# Patient Record
Sex: Female | Born: 1955 | Race: White | Hispanic: No | Marital: Married | Smoking: Former smoker
Health system: Southern US, Community
[De-identification: ages and names within clinical notes are randomized; demographics above are authoritative.]

## PROBLEM LIST (undated history)

## (undated) DIAGNOSIS — J309 Allergic rhinitis, unspecified: Secondary | ICD-10-CM

## (undated) DIAGNOSIS — J45909 Unspecified asthma, uncomplicated: Secondary | ICD-10-CM

## (undated) DIAGNOSIS — M545 Low back pain, unspecified: Secondary | ICD-10-CM

## (undated) DIAGNOSIS — E785 Hyperlipidemia, unspecified: Secondary | ICD-10-CM

## (undated) HISTORY — DX: Hyperlipidemia, unspecified: E78.5

## (undated) HISTORY — DX: Allergic rhinitis, unspecified: J30.9

## (undated) HISTORY — DX: Low back pain: M54.5

## (undated) HISTORY — DX: Unspecified asthma, uncomplicated: J45.909

## (undated) HISTORY — DX: Low back pain, unspecified: M54.50

---

## 2005-03-19 ENCOUNTER — Ambulatory Visit: Payer: Self-pay | Admitting: Pulmonary Disease

## 2006-06-05 ENCOUNTER — Ambulatory Visit: Payer: Self-pay | Admitting: Pulmonary Disease

## 2007-01-12 ENCOUNTER — Telehealth (INDEPENDENT_AMBULATORY_CARE_PROVIDER_SITE_OTHER): Payer: Self-pay | Admitting: *Deleted

## 2007-01-14 DIAGNOSIS — J309 Allergic rhinitis, unspecified: Secondary | ICD-10-CM | POA: Insufficient documentation

## 2007-01-14 DIAGNOSIS — Z87898 Personal history of other specified conditions: Secondary | ICD-10-CM | POA: Insufficient documentation

## 2007-01-14 DIAGNOSIS — K589 Irritable bowel syndrome without diarrhea: Secondary | ICD-10-CM

## 2007-01-14 DIAGNOSIS — J45909 Unspecified asthma, uncomplicated: Secondary | ICD-10-CM

## 2007-12-10 ENCOUNTER — Ambulatory Visit: Payer: Self-pay | Admitting: Pulmonary Disease

## 2007-12-26 DIAGNOSIS — M545 Low back pain: Secondary | ICD-10-CM

## 2008-01-21 ENCOUNTER — Ambulatory Visit: Payer: Self-pay | Admitting: Pulmonary Disease

## 2008-01-23 LAB — CONVERTED CEMR LAB
ALT: 18 units/L (ref 0–35)
AST: 23 units/L (ref 0–37)
Albumin: 3.6 g/dL (ref 3.5–5.2)
Alkaline Phosphatase: 25 units/L — ABNORMAL LOW (ref 39–117)
BUN: 12 mg/dL (ref 6–23)
Basophils Relative: 1.3 % (ref 0.0–3.0)
CO2: 32 meq/L (ref 19–32)
Chloride: 105 meq/L (ref 96–112)
Cholesterol: 199 mg/dL (ref 0–200)
Creatinine, Ser: 0.7 mg/dL (ref 0.4–1.2)
Eosinophils Absolute: 0 10*3/uL (ref 0.0–0.7)
Eosinophils Relative: 0.7 % (ref 0.0–5.0)
GFR calc non Af Amer: 93 mL/min
Hemoglobin, Urine: NEGATIVE
LDL Cholesterol: 120 mg/dL — ABNORMAL HIGH (ref 0–99)
Leukocytes, UA: NEGATIVE
Lymphocytes Relative: 34.7 % (ref 12.0–46.0)
Neutrophils Relative %: 56.6 % (ref 43.0–77.0)
Nitrite: NEGATIVE
Platelets: 162 10*3/uL (ref 150–400)
Potassium: 4.1 meq/L (ref 3.5–5.1)
RBC: 4.6 M/uL (ref 3.87–5.11)
Specific Gravity, Urine: 1.02 (ref 1.000–1.03)
TSH: 1.69 microintl units/mL (ref 0.35–5.50)
Total Bilirubin: 0.9 mg/dL (ref 0.3–1.2)
Total CHOL/HDL Ratio: 2.8
Urine Glucose: NEGATIVE mg/dL
Urobilinogen, UA: 0.2 (ref 0.0–1.0)
VLDL: 10 mg/dL (ref 0–40)
WBC: 4.8 10*3/uL (ref 4.5–10.5)

## 2008-02-02 ENCOUNTER — Telehealth (INDEPENDENT_AMBULATORY_CARE_PROVIDER_SITE_OTHER): Payer: Self-pay | Admitting: *Deleted

## 2009-09-21 ENCOUNTER — Ambulatory Visit: Payer: Self-pay | Admitting: Pulmonary Disease

## 2009-09-21 DIAGNOSIS — E7800 Pure hypercholesterolemia, unspecified: Secondary | ICD-10-CM

## 2010-02-05 NOTE — Assessment & Plan Note (Signed)
Summary: rov/jd   CC:  21 month ROV & review....  History of Present Illness: 55 y/o WF here for a follow up visit... she is the wife of Collins Dimaria & they live in [place], [place]- but still maintain their general medical care here in [place]...   ~  September 21, 2009:  Carla Wong has had a good year- doing well w/o new complaints or concerns... she has AR & Asthma & takes Zyrtek/ Nasonex/ Singulair/ Flovent & Proventil... also uses Mucinex Prn congestion... requests 90d refill perscriptions... states she had labs done by GYN in [place]- DrFletcher, and she reports that everything was "OK"...   Current Problem List:   PHYSICAL EXAMINATION (ICD-V70.0) - GYN= DrFletcher [place]... also takes calcium/ MVI/ etc...  ALLERGIC RHINITIS (ICD-477.9) - uses ZYRTEK, NASONEX, MUCINEX...  ASTHMA (ICD-493.90) - on SINGULAIR 10mg /d, PROVENTIL HFA 2sp Prn, & FLOVENT 220 2puffsBid... she has had a good year without exacerbations...  Hx of HYPERCHOLESTEROLEMIA, BORDERLINE (ICD-272.4) - on diet alone...  ~  FLP here 1/10 showed TChol 199, TG 48, HDL 70, LDL 120... discussed diet Rx.  ~  9/11: she notes labs done this yr by GYN & Chol was "OK" per pt hx...  IRRITABLE BOWEL SYNDROME (ICD-564.1) - mild symptoms usually related to eating the wrong things... she is now 55 y/o and needs screening colonoscopy> she is undecided about getting this in [place] vs [place] but she will sched at her convenience.  Hx of BACK PAIN, LUMBAR (ICD-724.2) - uses OTC pain meds including Ibuprofen, Tylenol...  HEADACHES, HX OF (ICD-V13.8) - prev used Midrin w/ good relief, now that this is no longer avail we will switch to [facility] for Prn use.   Preventive Screening-Counseling & Management  Alcohol-Tobacco     Smoking Status: quit     Year Quit: 1982  Allergies (verified): No Known Drug Allergies  Comments:  Nurse/Medical Assistant: The patient's medications and allergies were reviewed with the patient and were updated  in the Medication and Allergy Lists.  Past History:  Past Medical History: ALLERGIC RHINITIS (ICD-477.9) ASTHMA (ICD-493.90) Hx of HYPERCHOLESTEROLEMIA, BORDERLINE (ICD-272.4) IRRITABLE BOWEL SYNDROME (ICD-564.1) Hx of BACK PAIN, LUMBAR (ICD-724.2) HEADACHES, HX OF (ICD-V13.8)  Family History: Reviewed history from 12/10/2007 and no changes required. Father alive age 54 w/ HBP & CAD- s/p CABG Mother alive age 47 w/ HBP, Hyperchol, & heart disease 1 Sibling- sister w/ hx uterine cancer, s/p hyst  Social History: Reviewed history from 12/10/2007 and no changes required. married- husb [name] & they live in [place] 2 children quit smoking in 1982---smoked for about 3-4 years social alcohol  Review of Systems       The patient complains of nasal congestion, cough, and hay fever.  The patient denies fever, chills, sweats, anorexia, fatigue, weakness, malaise, weight loss, sleep disorder, blurring, diplopia, eye irritation, eye discharge, vision loss, eye pain, photophobia, earache, ear discharge, tinnitus, decreased hearing, nosebleeds, sore throat, hoarseness, chest pain, palpitations, syncope, dyspnea on exertion, orthopnea, PND, peripheral edema, dyspnea at rest, excessive sputum, hemoptysis, wheezing, pleurisy, nausea, vomiting, diarrhea, constipation, change in bowel habits, abdominal pain, melena, hematochezia, jaundice, gas/bloating, indigestion/heartburn, dysphagia, odynophagia, dysuria, hematuria, urinary frequency, urinary hesitancy, nocturia, incontinence, back pain, joint pain, joint swelling, muscle cramps, muscle weakness, stiffness, arthritis, sciatica, restless legs, leg pain at night, leg pain with exertion, rash, itching, dryness, suspicious lesions, paralysis, paresthesias, seizures, tremors, vertigo, transient blindness, frequent falls, frequent headaches, difficulty walking, depression, anxiety, memory loss, confusion, cold intolerance, heat intolerance, polydipsia,  polyphagia, polyuria, unusual weight change, abnormal bruising, bleeding, enlarged lymph nodes, urticaria, and recurrent infections.    Vital Signs:  Patient profile:   55 year old female Height:      63.75 inches Weight:      127.50 pounds BMI:     22.14 O2 Sat:      99 % on Room air Temp:     98.1 degrees F oral Pulse rate:   63 / minute BP sitting:   120 / 78  (right arm) Cuff size:   regular  Vitals Entered By: Randell Loop CMA (September 21, 2009 12:03 PM)  O2 Sat at Rest %:  99 O2 Flow:  Room air CC: 21 month ROV & review... Is Patient Diabetic? No Pain Assessment Patient in pain? no      Comments meds updated today with pt   Physical Exam  Additional Exam:  WD, WN, 55 y/o WF in NAD GENERAL:  Alert & oriented; pleasant & cooperative... HEENT:  Carla Wong, EOM-wnl, PERRLA, Fundi-benign, EACs-clear, TMs-wnl, NOSE-congested, THROAT-clear & wnl. NECK:  Supple w/ full ROM; no JVD; normal carotid impulses w/o bruits; no thyromegaly or nodules palpated; no lymphadenopathy. CHEST:  Clear to P & A; without wheezes/ rales/ or rhonchi. HEART:  Regular Rhythm; without murmurs/ rubs/ or gallops. ABDOMEN:  Soft & nontender; normal bowel sounds; no organomegaly or masses detected. EXT: without deformities or arthritic changes; no varicose veins/ venous insuffic/ or edema. NEURO:  CN's intact; motor testing normal; sensory testing normal; gait normal & balance OK. DERM:  No lesions noted; no rash etc...    Impression & Recommendations:  Problem # 1:  ALLERGIC RHINITIS (ICD-477.9) Stable on meds and Mucinex OTC... Her updated medication list for this problem includes:    Zyrtec Allergy 10 Mg Tabs (Cetirizine hcl) .Marland Kitchen... Take 1 tablet by mouth once a day    Nasonex 50 Mcg/act Susp (Mometasone furoate) .Marland Kitchen... 2 sp in each nostril at bedtime...  Problem # 2:  ASTHMA (ICD-493.90) Stable on meds listed... Her updated medication list for this problem includes:    Singulair 10 Mg Tabs  (Montelukast sodium) .Marland Kitchen... Take 1 tablet by mouth once a day    Proventil Hfa 108 (90 Base) Mcg/act Aers (Albuterol sulfate) .Marland Kitchen... 2 sprays every 6 h as needed for wheezing...    Flovent Hfa 220 Mcg/act Aero (Fluticasone propionate  hfa) ..... Inhale 2 puffs two times a day  Problem # 3:  Hx of HYPERCHOLESTEROLEMIA, BORDERLINE (ICD-272.4) She had labs from GYN & Chol was OK by her report... we reviewed diet + exercise Rx...  Problem # 4:  IRRITABLE BOWEL SYNDROME (ICD-564.1) Doing well from the GI perspective but needs screening colonoscopy & she is asked to sched this at her convenience...  Problem # 5:  HEADACHES, HX OF (ICD-V13.8) We discussed replacing the Midrin she has relied on for yrs... try Vicodin for Prn use.  Complete Medication List: 1)  Zyrtec Allergy 10 Mg Tabs (Cetirizine hcl) .... Take 1 tablet by mouth once a day 2)  Nasonex 50 Mcg/act Susp (Mometasone furoate) .... 2 sp in each nostril at bedtime.Marland KitchenMarland Kitchen 3)  Singulair 10 Mg Tabs (Montelukast sodium) .... Take 1 tablet by mouth once a day 4)  Proventil Hfa 108 (90 Base) Mcg/act Aers (Albuterol sulfate) .... 2 sprays every 6 h as needed for wheezing... 5)  Flovent Hfa 220 Mcg/act Aero (Fluticasone propionate  hfa) .... Inhale 2 puffs two times a day 6)  Necon 1/50 (28) 1-50 Mg-mcg Tabs (  Norethindrone-mestranol) .... Take 1 tablet by mouth once a day 7)  Hydrocodone-acetaminophen 5-500 Mg Tabs (Hydrocodone-acetaminophen) .... Take 1 tab by mouth every 6 h as needed for severe headaches... 8)  [facility]vs Ibuprofen 200 Mg Tabs (Ibuprofen) .... As needed 9)  Multivitamins Tabs (Multiple vitamin) .... Take 1 tablet by mouth once a day 10)  B-50 Complex Tabs (B complex-folic acid) .... Take 1 tablet by mouth once a day  Patient Instructions: 1)  Today we updated your med list- see below.... 2)  We refilled your perscriptions for 90d supplies as requested... 3)  Don't forget to schedule a routine screening colonoscopy at your  convenience... 4)  Call for any problems.Marland KitchenMarland Kitchen 5)  Please schedule a follow-up appointment in 1 year. Prescriptions: HYDROCODONE-ACETAMINOPHEN 5-500 MG TABS (HYDROCODONE-ACETAMINOPHEN) take 1 tab by mouth every 6 H as needed for severe headaches...  #50 x 6   Entered and Authorized by:   Michele Mcalpine MD   Signed by:   Michele Mcalpine MD on 09/21/2009   Method used:   Print then Give to Patient   RxID:   [id number] FLOVENT HFA 220 MCG/ACT  AERO (FLUTICASONE PROPIONATE  HFA) inhale 2 puffs two times a day  #3 x 4   Entered and Authorized by:   Michele Mcalpine MD   Signed by:   Michele Mcalpine MD on 09/21/2009   Method used:   Print then Give to Patient   RxID:   [id number] PROVENTIL HFA 108 (90 BASE) MCG/ACT AERS (ALBUTEROL SULFATE) 2 sprays every 6 H as needed for wheezing...  #3 x 4   Entered and Authorized by:   Michele Mcalpine MD   Signed by:   Michele Mcalpine MD on 09/21/2009   Method used:   Print then Give to Patient   RxID:   [id number] SINGULAIR 10 MG  TABS (MONTELUKAST SODIUM) Take 1 tablet by mouth once a day  #90 x 4   Entered and Authorized by:   Michele Mcalpine MD   Signed by:   Michele Mcalpine MD on 09/21/2009   Method used:   Print then Give to Patient   RxID:   [id number] NASONEX 50 MCG/ACT SUSP (MOMETASONE FUROATE) 2 sp in each nostril at bedtime...  #3 x 4   Entered and Authorized by:   Michele Mcalpine MD   Signed by:   Michele Mcalpine MD on 09/21/2009   Method used:   Print then Give to Patient   RxID:   [id number]

## 2010-09-20 ENCOUNTER — Ambulatory Visit: Payer: Self-pay | Admitting: Pulmonary Disease

## 2010-09-27 ENCOUNTER — Telehealth: Payer: Self-pay | Admitting: Pulmonary Disease

## 2010-09-27 MED ORDER — MONTELUKAST SODIUM 10 MG PO TABS: 10.0000 mg | ORAL_TABLET | Freq: Every day | ORAL | Status: DC

## 2010-09-27 NOTE — Telephone Encounter (Signed)
Called pt and made aware rx was sent, at first I sent for 30 days supply, and then per pt request, called back and changed this to a 90 day supply. Advised pt to keep her appt for refills and she verbalized understanding.

## 2010-10-04 ENCOUNTER — Encounter: Payer: Self-pay | Admitting: Pulmonary Disease

## 2010-10-04 ENCOUNTER — Ambulatory Visit (INDEPENDENT_AMBULATORY_CARE_PROVIDER_SITE_OTHER): Payer: BC Managed Care – PPO | Admitting: Pulmonary Disease

## 2010-10-04 ENCOUNTER — Other Ambulatory Visit (INDEPENDENT_AMBULATORY_CARE_PROVIDER_SITE_OTHER): Payer: BC Managed Care – PPO

## 2010-10-04 ENCOUNTER — Ambulatory Visit (INDEPENDENT_AMBULATORY_CARE_PROVIDER_SITE_OTHER)
Admission: RE | Admit: 2010-10-04 | Discharge: 2010-10-04 | Disposition: A | Discharge status: Home / Self Care | Payer: BC Managed Care – PPO | Source: Ambulatory Visit | Attending: Pulmonary Disease | Admitting: Pulmonary Disease

## 2010-10-04 VITALS — BP 114/78 | HR 59 | Temp 97.0°F | Ht 63.75 in | Wt 127.6 lb

## 2010-10-04 DIAGNOSIS — J309 Allergic rhinitis, unspecified: Secondary | ICD-10-CM

## 2010-10-04 DIAGNOSIS — K589 Irritable bowel syndrome without diarrhea: Secondary | ICD-10-CM

## 2010-10-04 DIAGNOSIS — Z0000 Encounter for general adult medical examination without abnormal findings: Secondary | ICD-10-CM

## 2010-10-04 DIAGNOSIS — Z87898 Personal history of other specified conditions: Secondary | ICD-10-CM

## 2010-10-04 DIAGNOSIS — R059 Cough, unspecified: Secondary | ICD-10-CM

## 2010-10-04 DIAGNOSIS — Z23 Encounter for immunization: Secondary | ICD-10-CM

## 2010-10-04 DIAGNOSIS — R05 Cough: Secondary | ICD-10-CM

## 2010-10-04 DIAGNOSIS — J45909 Unspecified asthma, uncomplicated: Secondary | ICD-10-CM

## 2010-10-04 LAB — TSH: TSH: 1.67 u[IU]/mL (ref 0.35–5.50)

## 2010-10-04 LAB — BASIC METABOLIC PANEL
CO2: 27 mEq/L (ref 19–32)
Calcium: 8.9 mg/dL (ref 8.4–10.5)
GFR: 86.56 mL/min (ref >60.00)
Potassium: 4.1 mEq/L (ref 3.5–5.1)
Sodium: 139 mEq/L (ref 135–145)

## 2010-10-04 LAB — CBC WITH DIFFERENTIAL/PLATELET
Basophils Relative: 1.6 % (ref 0.0–3.0)
Eosinophils Relative: 0.6 % (ref 0.0–5.0)
Lymphocytes Relative: 31.8 % (ref 12.0–46.0)
MCV: 90.9 fl (ref 78.0–100.0)
Monocytes Absolute: 0.4 10*3/uL (ref 0.1–1.0)
Monocytes Relative: 6.5 % (ref 3.0–12.0)
Neutrophils Relative %: 59.5 % (ref 43.0–77.0)
RBC: 4.66 Mil/uL (ref 3.87–5.11)
WBC: 5.8 10*3/uL (ref 4.5–10.5)

## 2010-10-04 LAB — HEPATIC FUNCTION PANEL
ALT: 22 U/L (ref 0–35)
Albumin: 3.8 g/dL (ref 3.5–5.2)
Total Bilirubin: 0.7 mg/dL (ref 0.3–1.2)

## 2010-10-04 LAB — URINALYSIS
Leukocytes, UA: NEGATIVE
Nitrite: NEGATIVE
Specific Gravity, Urine: 1.025 (ref 1.000–1.030)
Total Protein, Urine: NEGATIVE
pH: 5.5 (ref 5.0–8.0)

## 2010-10-04 LAB — LIPID PANEL
HDL: 67.7 mg/dL (ref >39.00)
Total CHOL/HDL Ratio: 3

## 2010-10-04 MED ORDER — PHENYLEPH-PROMETHAZINE-COD 5-6.25-10 MG/5ML PO SYRP: 5.0000 mL | ORAL_SOLUTION | Freq: Four times a day (QID) | ORAL | Status: DC | PRN

## 2010-10-04 MED ORDER — MONTELUKAST SODIUM 10 MG PO TABS: 10.0000 mg | ORAL_TABLET | Freq: Every day | ORAL | Status: DC

## 2010-10-04 MED ORDER — FLUTICASONE PROPIONATE HFA 220 MCG/ACT IN AERO: 2.0000 | INHALATION_SPRAY | Freq: Two times a day (BID) | RESPIRATORY_TRACT | Status: DC

## 2010-10-04 MED ORDER — ALBUTEROL SULFATE HFA 108 (90 BASE) MCG/ACT IN AERS: 2.0000 | INHALATION_SPRAY | Freq: Four times a day (QID) | RESPIRATORY_TRACT | Status: AC | PRN

## 2010-10-04 MED ORDER — MOMETASONE FUROATE 50 MCG/ACT NA SUSP: 2.0000 | Freq: Every day | NASAL | Status: DC

## 2010-10-04 MED ORDER — ISOMETHEPTENE-APAP-DICHLORAL 65-325-100 MG PO CAPS: 1.0000 | ORAL_CAPSULE | Freq: Four times a day (QID) | ORAL | Status: AC | PRN

## 2010-10-04 NOTE — Progress Notes (Signed)
Subjective:    Patient ID: Carla Wong, female    DOB: 06/21/1955, 55 y.o.   MRN: [id number]  HPI 55 y/o WF here for a follow up visit... she is the wife of Carla Wong & they live in [place]- but still maintain their general medical care here in [place]...  ~  September 21, 2009:  [name] has had a good year- doing well w/o new complaints or concerns... she has AR & Asthma & takes Zyrtek/ Nasonex/ Singulair/ Flovent & Proventil... also uses Mucinex Prn congestion... requests 90d refill perscriptions... states she had labs done by GYN in [place]- Carla Wong, and she reports that everything was "OK"...  ~  October 05, 2010:  Yearly ROV and both pt & husb are perplexed by a cough that has developed ?over several months> dry hacking cough, comes & goes, day & night, occas light phlegm, no wheezing or SOB, not on ACE, denies any GI symptoms/ reflux/ etc;  She thinks prob related to drainage but no better w/ Zytrek, Saline, Nasonex; Mucinex OTC/ Robitussin helps a little;  She has hx Asthma on Flovent 220 & she cut down to 2spQam only; she has Proventil MDI as rescue inhaler but seldom if ever uses this; she was seen by [facility] in [facility] w/ Rx for antibiotic but no better...  We discussed further eval w/ PFT now- FVC=3.16 (99%), FEV1=2.35 (92%), %1sec=75, mid-flows=68% predicted;  CXR- clear, wnl; and Fasting LABS- all essent wnl;   We discussed management w/ increasing Asthma therapy- use the Flovent220 2puffsBid, continue Proventil Prn, continue Singulair/ Zyrtek/ Mucinex/ Saline/ etc;  AND aggressive antireflux regimen w/ Prilosec OTC 20mg  Bid ( before meals), npo after dinner in the eve, elev HOB on 6" blocks;  We will also write for a cough syrup Q6H as needed...          Problem List:   ALLERGIC RHINITIS (ICD-477.9) - uses ZYRTEK, NASONEX, MUCINEX...  ASTHMA (ICD-493.90) - on SINGULAIR 10mg /d, PROVENTIL HFA 2sp Prn & FLOVENT 220 2puffsBid; she developed a refractory cough in 2012> ?asthma vs  reflux related> treated w/ maximizing meds, antireflux regimen, cough syrup... ~  CXR 12/09 showed clear lungs w/ min apical pleural changes, NAD... ~  PFT 9/12 showed FVC=3.16 (99%), FEV1=2.35 (92%), %1sec=75, mid-flows=68% predicted... ~  CXR 9/12 showed clear lungs, WNL.Marland KitchenMarland Kitchen  Hx of HYPERCHOLESTEROLEMIA, BORDERLINE (ICD-272.4) - on diet alone... ~  FLP here 1/10 showed TChol 199, TG 48, HDL 70, LDL 120... discussed diet Rx. ~  9/11: she notes labs done this yr by GYN & Chol was "OK" per pt hx... ~  FLP 9/12 showed TChol 177, TG 58, HDL 68, LDL 98 > all parameters at goal on diet alone.  IRRITABLE BOWEL SYNDROME (ICD-564.1) - mild symptoms usually related to eating the wrong things... she is now 55 y/o and still needs screening colonoscopy> she is undecided about getting this in [place] vs [place] but she will sched at her convenience.  Hx of BACK PAIN, LUMBAR (ICD-724.2) - uses OTC pain meds including Ibuprofen, Tylenol...  HEADACHES, HX OF (ICD-V13.8) - prev used Midrin w/ good relief, now that this is no longer avail we will switch to VICODIN for Prn use. ~  9/12:  She tells me that Midrin is once again avail & she would like this refilled...  HEALTH MAINTENANCE >> ~  GI:  She is overdue for a routine screening colonoscopy & has promised to pick a gastroenterologist either here or in [facility]... ~  [place]N= Carla Wong [place]... also takes calcium/ MVI/ etc... ~  Immunizations:  She tells me she had TDAP within the last few yrs;  OK Flu shot today...   No past surgical history on file.   Outpatient Encounter Prescriptions as of 10/04/2010  Medication Sig Dispense Refill  . albuterol (PROVENTIL HFA) 108 (90 BASE) MCG/ACT inhaler Inhale 2 puffs into the lungs every 6 (six) hours as needed.  3 Inhaler  3  . cetirizine (ZYRTEC) 10 MG tablet Take 10 mg by mouth daily.        . fluticasone (FLOVENT HFA) 220 MCG/ACT inhaler Inhale 2 puffs into the lungs 2 (two) times daily.  3 Inhaler  3  .  ibuprofen (ADVIL,MOTRIN) 200 MG tablet Take 200 mg by mouth every 6 (six) hours as needed.        . mometasone (NASONEX) 50 MCG/ACT nasal spray Place 2 sprays into the nose at bedtime.  51 g  3  . montelukast (SINGULAIR) 10 MG tablet Take 1 tablet (10 mg total) by mouth daily.  90 tablet  3  . Multiple Vitamin (MULTIVITAMIN) tablet Take 1 tablet by mouth daily.        . Norethindrone-Mestranol (NECON 1/50, 28,) 1-50 MG-MCG tablet Take 1 tablet by mouth daily.        . Vitamins-Lipotropics (B-50 COMPLEX) TABS Take 1 tablet by mouth daily.        Marland Kitchen isometheptene-acetaminophen-dichloralphenazone (MIDRIN) 65-325-100 MG capsule Take 1 capsule by mouth 4 (four) times daily as needed for migraine.  100 capsule  5  . Phenyleph-Promethazine-Cod 5-6.25-10 MG/5ML SYRP Take 5 mLs by mouth every 6 (six) hours as needed (for cough).  120 mL  1  . DISCONTD: HYDROcodone-acetaminophen (VICODIN) 5-500 MG per tablet Take 1 tablet by mouth every 6 (six) hours as needed.          No Known Allergies   Current Medications, Allergies, Past Medical History, Past Surgical History, Family History, and Social History were reviewed in [employer] record.    Review of Systems        The patient complains of nasal congestion, cough, and some drainage.  The patient denies fever, chills, sweats, anorexia, fatigue, weakness, malaise, weight loss, sleep disorder, blurring, diplopia, eye irritation, eye discharge, vision loss, eye pain, photophobia, earache, ear discharge, tinnitus, decreased hearing, nosebleeds, sore throat, hoarseness, chest pain, palpitations, syncope, dyspnea on exertion, orthopnea, PND, peripheral edema, dyspnea at rest, excessive sputum, hemoptysis, wheezing, pleurisy, nausea, vomiting, diarrhea, constipation, change in bowel habits, abdominal pain, melena, hematochezia, jaundice, gas/bloating, indigestion/heartburn, dysphagia, odynophagia, dysuria, hematuria, urinary frequency, urinary  hesitancy, nocturia, incontinence, back pain, joint pain, joint swelling, muscle cramps, muscle weakness, stiffness, arthritis, sciatica, restless legs, leg pain at night, leg pain with exertion, rash, itching, dryness, suspicious lesions, paralysis, paresthesias, seizures, tremors, vertigo, transient blindness, frequent falls, frequent headaches, difficulty walking, depression, anxiety, memory loss, confusion, cold intolerance, heat intolerance, polydipsia, polyphagia, polyuria, unusual weight change, abnormal bruising, bleeding, enlarged lymph nodes, urticaria, and recurrent infections.     Objective:   Physical Exam     WD, WN, 55 y/o WF in NAD GENERAL:  Alert & oriented; pleasant & cooperative... HEENT:  [place]/AT, EOM-wnl, PERRLA, Fundi-benign, EACs-clear, TMs-wnl, NOSE-congested, THROAT-clear & wnl. NECK:  Supple w/ full ROM; no JVD; normal carotid impulses w/o bruits; no thyromegaly or nodules palpated; no lymphadenopathy. CHEST:  Clear to P & A; without wheezes/ rales/ or rhonchi heard... HEART:  Regular Rhythm; without murmurs/ rubs/ or gallops detected.Marland KitchenMarland Kitchen  ABDOMEN:  Soft & nontender; normal bowel sounds; no organomegaly or masses palpated... EXT: without deformities or arthritic changes; no varicose veins/ venous insuffic/ or edema. NEURO:  CN's intact; motor testing normal; sensory testing normal; gait normal & balance OK. DERM:  No lesions noted; no rash etc...  PFT:  Normal spirometry x mild small airways disease... CXR:  Clear & WNL.Marland KitchenMarland Kitchen LABS:  All essentially WNL.Marland KitchenMarland Kitchen   Assessment & Plan:   COUGH>  We discussed cough variant asthma & Rx w/ Flovent220, Singulair10mg , Proair rescue inhaler, Mucinex, etc;  She thinks cough related to drainage & she will work on this w/ Zyrtek, Nasonex, Saline, etc;  Finally we discussed the role of reflux in refractory cough> PPI Bid, elev HOB, npo after dinner, etc...  Allergic rhinitis>  As noted- rx w/ Antihist, Saline, Nasonex, etc...  Asthma>   Continue regular dosing w/ her Flovent 220- 2puffs BID, plus MucinexDM, Fluids, etc... Wrote for Phen expect w/ codeine for prn use...  Borderline Hyperchol>  FLP this year looks fantastic on diet alone...  Hx IBS>  She is asymptomatic but needs routine colonoscopy, now age 37; she promises to sched at her convenience...  Hx LBP>  Doing satis, exercising, walking, no difficulty at present...  Hx HAs>  She requests refill Midrin noting that it is back on the market now- ok.Marland KitchenMarland Kitchen

## 2010-10-04 NOTE — Patient Instructions (Signed)
Today we updated your med list in EPIC...   We refilled your meds per request...  For your cough:    We decided to increase your Asthma meds>>       Take the Singulair 10mg  every day...       Take the Flovent 220- 2 puffs TWICE daily & rinse after each session...       Continue the Proventil inhaler as needed for wheezing...       You may increase the MUCINEX DM 1-2 tabs twice daily...       You may try the OTC DELSYM 2 tsp twice daily....        We will write for a reasonably priced cough syrup to take every 6H as needed...  The other aspect that we discussed is the need for a vigorous antireflux regimen, since silent reflux is a common culprit causing cough>>    Take PRILOSEC OTC 20mg  TWICE daily- 30 min before breakfast & dinner...    Make an effort NOT to eat or drink much after dinner in the eve...    Elevate the head of your bed on 6" blocks  Today we did your follow up CXR & fasting blood work...    Please call the PHONE TREE in a few days for your results...    Dial [id number] & when prompted enter your patient number followed by the # symbol...    Your patient number is:  [id number]#  Let me know how this program is working & if your cough is improving.Marland KitchenMarland Kitchen

## 2010-12-06 ENCOUNTER — Telehealth: Payer: Self-pay | Admitting: Pulmonary Disease

## 2010-12-06 MED ORDER — FLUTICASONE PROPIONATE HFA 220 MCG/ACT IN AERO: 2.0000 | INHALATION_SPRAY | Freq: Two times a day (BID) | RESPIRATORY_TRACT | Status: DC

## 2010-12-06 MED ORDER — MONTELUKAST SODIUM 10 MG PO TABS: 10.0000 mg | ORAL_TABLET | Freq: Every day | ORAL | Status: DC

## 2010-12-06 NOTE — Telephone Encounter (Signed)
Pt has misplaced her prescriptions for Flovent and Singulair from Sept 2012. New prescriptions have been sent to her pharmacy and the pt is aware.

## 2011-10-10 ENCOUNTER — Ambulatory Visit: Payer: BC Managed Care – PPO | Admitting: Pulmonary Disease

## 2011-11-09 ENCOUNTER — Other Ambulatory Visit: Payer: Self-pay | Admitting: Pulmonary Disease

## 2011-11-28 ENCOUNTER — Ambulatory Visit: Payer: Self-pay | Admitting: Pulmonary Disease

## 2012-01-09 ENCOUNTER — Ambulatory Visit: Payer: Self-pay | Admitting: Pulmonary Disease

## 2012-05-10 ENCOUNTER — Telehealth: Payer: Self-pay | Admitting: Pulmonary Disease

## 2012-05-10 NOTE — Telephone Encounter (Signed)
lmomtcb x1 

## 2012-05-10 NOTE — Telephone Encounter (Signed)
Returning call can be reached at [phone].Carla Wong

## 2012-05-11 MED ORDER — FLUTICASONE PROPIONATE HFA 220 MCG/ACT IN AERO: 2.0000 | INHALATION_SPRAY | Freq: Two times a day (BID) | RESPIRATORY_TRACT | Status: DC

## 2012-05-11 MED ORDER — MONTELUKAST SODIUM 10 MG PO TABS: ORAL_TABLET | ORAL | Status: DC

## 2012-05-11 MED ORDER — PHENYLEPH-PROMETHAZINE-COD 5-6.25-10 MG/5ML PO SYRP: 5.0000 mL | ORAL_SOLUTION | Freq: Four times a day (QID) | ORAL | Status: DC | PRN

## 2012-05-11 NOTE — Telephone Encounter (Signed)
Per SN----  Ok to refill the flovent and singulair and cough syrup.  thanks

## 2012-05-11 NOTE — Telephone Encounter (Signed)
Pt aware RX has been sent for 30 days only as she has not been seen since 2012 and she must keep pending appt. Also she is requesting VC-codeine cough syrup. She stated she is having trouble with her asthma/allergies. She ahs began to develop a cough and this has helped. Please advise SN thanks Last OV 10/04/10 Pending 06/23/12 No Known Allergies

## 2012-05-11 NOTE — Telephone Encounter (Signed)
Cough medication has been called in. Pt is aware.

## 2012-06-23 ENCOUNTER — Other Ambulatory Visit (INDEPENDENT_AMBULATORY_CARE_PROVIDER_SITE_OTHER): Payer: BC Managed Care – PPO

## 2012-06-23 ENCOUNTER — Encounter: Payer: Self-pay | Admitting: Pulmonary Disease

## 2012-06-23 ENCOUNTER — Ambulatory Visit (INDEPENDENT_AMBULATORY_CARE_PROVIDER_SITE_OTHER): Payer: BC Managed Care – PPO | Admitting: Pulmonary Disease

## 2012-06-23 VITALS — BP 110/72 | HR 58 | Temp 97.9°F | Ht 63.75 in | Wt 128.4 lb

## 2012-06-23 DIAGNOSIS — J45909 Unspecified asthma, uncomplicated: Secondary | ICD-10-CM

## 2012-06-23 DIAGNOSIS — E785 Hyperlipidemia, unspecified: Secondary | ICD-10-CM

## 2012-06-23 DIAGNOSIS — K589 Irritable bowel syndrome without diarrhea: Secondary | ICD-10-CM

## 2012-06-23 DIAGNOSIS — Z0000 Encounter for general adult medical examination without abnormal findings: Secondary | ICD-10-CM

## 2012-06-23 DIAGNOSIS — M545 Low back pain, unspecified: Secondary | ICD-10-CM

## 2012-06-23 DIAGNOSIS — M353 Polymyalgia rheumatica: Secondary | ICD-10-CM

## 2012-06-23 DIAGNOSIS — J309 Allergic rhinitis, unspecified: Secondary | ICD-10-CM

## 2012-06-23 LAB — LIPID PANEL
HDL: 70.9 mg/dL (ref >39.00)
Triglycerides: 67 mg/dL (ref 0.0–149.0)

## 2012-06-23 MED ORDER — MONTELUKAST SODIUM 10 MG PO TABS: ORAL_TABLET | ORAL | Status: DC

## 2012-06-23 MED ORDER — FLUTICASONE PROPIONATE HFA 220 MCG/ACT IN AERO: 2.0000 | INHALATION_SPRAY | Freq: Two times a day (BID) | RESPIRATORY_TRACT | Status: DC

## 2012-06-23 NOTE — Patient Instructions (Addendum)
Today we updated your med list in our EPIC system...    Continue your current medications the same...    We refilled the meds you requested...  Today we did your follow up Lipid Profile...    We will contact you w/ the results when available...   Call for any questions...  Let's plan a follow up visit in 46yr, sooner if needed for problems.Marland KitchenMarland Kitchen

## 2012-06-23 NOTE — Progress Notes (Signed)
Subjective:    Patient ID: Carla Wong, female    DOB: May 08, 1955, 57 y.o.   MRN: [id number]  HPI  57 y/o WF here for a follow up visit... she is the wife of Carla Wong & they live in [place], [place]- but still maintain their general medical care here in [place]...  ~  September 21, 2009:  Carla Wong has had a good year- doing well w/o new complaints or concerns... she has AR & Asthma & takes Zyrtek/ Nasonex/ Singulair/ Flovent & Proventil... also uses Mucinex Prn congestion... requests 90d refill perscriptions... states she had labs done by GYN in [place]- DrFletcher, and she reports that everything was "OK"...  ~  October 05, 2010:  Yearly ROV and both pt & husb are perplexed by a cough that has developed ?over several months> dry hacking cough, comes & goes, day & night, occas light phlegm, no wheezing or SOB, not on ACE, denies any GI symptoms/ reflux/ etc;  She thinks prob related to drainage but no better w/ Zytrek, Saline, Nasonex; Mucinex OTC/ Robitussin helps a little;  She has hx Asthma on Flovent 220 & she cut down to 2spQam only; she has Proventil MDI as rescue inhaler but seldom if ever uses this; she was [facility] by [facility] w/ Rx for antibiotic but no better...  We discussed further eval w/ PFT now- FVC=3.16 (99%), FEV1=2.35 (92%), %1sec=75, mid-flows=68% predicted;  CXR- clear, wnl; and Fasting LABS- all essent wnl;   We discussed management w/ increasing Asthma therapy- use the Flovent220 2puffsBid, continue Proventil Prn, continue Singulair/ Zyrtek/ Mucinex/ Saline/ etc;  AND aggressive antireflux regimen w/ Prilosec OTC 20mg  Bid ( before meals), npo after dinner in the eve, elev HOB on 6" blocks;  We will also write for a cough syrup Q6H as needed...  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 We reviewed the following medical problems during today's office visit >>     AR> on OTC antihist prn, symptoms well controlled...    Asthma> on Singulair10, Flovent220-2spBid, ProventilHFA prn; she reports doing well- no asthma exac in some time, feels well, breathing good...    CHOL> on diet alone; FLP 9/12 was at goals on diet alone; Labs 6/14 showed TChol 203, TG 67, HDL 71, LDL 118 & rec to continue diet/ exercise/ etc...    GI- IBS> stable, regular BMs & no recent IBS complaints but she still hasn't had her routine screening colonoscopy & we discussed setting this up in [place]...    ?PMR> see above info regarding eval by Rheum in [facility]; advised to f/u w/ Rheum & try to get  records for [place] to review...    HxLBP> on OTC analgesics as needed; she is doing satis, exercising some & we reviewed back strengthening exercises etc...    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 We reviewed prob list, meds, xrays and labs> see below for updates >>  LABS 6/14:  She had extensive labs done in [facility] recently> we did FLP w/ results above...           Problem List:   ALLERGIC RHINITIS (ICD-477.9) - uses ZYRTEK, NASONEX, MUCINEX...  ASTHMA (ICD-493.90) - on SINGULAIR 10mg /d, PROVENTIL HFA 2sp Prn & FLOVENT 220 2puffsBid; she developed a refractory cough in 2012> ?asthma vs reflux related> treated w/ maximizing meds, antireflux regimen, cough syrup... ~  CXR 12/09 showed clear lungs w/  min apical pleural changes, NAD... ~  PFT 9/12 showed FVC=3.16 (99%), FEV1=2.35 (92%), %1sec=75, mid-flows=68% predicted... ~  CXR 9/12 showed clear lungs, WNL... ~  6/14:  Asthma is stable on singulair10, flovent220-2spBid & ProventilHFA prn...   Hx of HYPERCHOLESTEROLEMIA, BORDERLINE (ICD-272.4) - on diet alone... ~  FLP here 1/10 showed TChol 199, TG 48, HDL 70, LDL 120... discussed diet Rx. ~  9/11: she notes labs done this yr by GYN & Chol was "OK" per pt hx... ~  FLP 9/12 showed TChol 177, TG 58, HDL 68, LDL 98 > all parameters at goal on diet alone. ~  FLP 6/14 on diet alone showed TChol 203, TG 67, HDL 71, LDL 118   IRRITABLE BOWEL SYNDROME (ICD-564.1) - mild symptoms usually related to eating the wrong things... she is now 57 y/o and still needs screening colonoscopy> she is undecided about getting this in [place] vs [place] but she will sched at her convenience. ~  6/14:  She has still not scheduled her routine colonoscopy 7 we again reviewed the need for this important screening procedure...  Hx of BACK PAIN, LUMBAR (ICD-724.2) - uses OTC pain meds including Ibuprofen, Tylenol...  HEADACHES, HX OF (ICD-V13.8) - prev used Midrin w/ good relief, now that this is no longer avail we will switch to VICODIN for Prn use. ~  9/12:  She tells me that Midrin is once again avail & she would like this refilled...  HEALTH MAINTENANCE >> ~  GI:  She is overdue for a routine screening colonoscopy & has promised to pick a gastroenterologist either here or in [facility]... ~  GYN= DrFletcher in [place] on [place]... also takes calcium/ MVI/ etc... ~  Immunizations:  She tells me she had TDAP within the last few yrs;  Reminded to get yearly seasonal Flu vaccine each fall...   No past surgical history on file.   Outpatient Encounter Prescriptions as of 06/23/2012  Medication Sig Dispense Refill  . albuterol (PROVENTIL HFA) 108 (90 BASE) MCG/ACT inhaler Inhale 2 puffs into the lungs every 6 (six) hours as  needed.  3 Inhaler  3  . cetirizine (ZYRTEC) 10 MG tablet Take 10 mg by mouth daily.        . fluticasone (FLOVENT HFA) 220 MCG/ACT inhaler Inhale 2 puffs into the lungs 2 (two) times daily.  1 Inhaler  0  . ibuprofen (ADVIL,MOTRIN) 200 MG tablet Take 200 mg by mouth every 6 (six) hours as needed.        . montelukast (SINGULAIR) 10 MG tablet TAKE 1 TABLET DAILY  30 tablet  0  . Multiple Vitamin (MULTIVITAMIN) tablet Take 1 tablet by mouth daily.        Marland Kitchen  Norethindrone-Mestranol (NECON 1/50, 28,) 1-50 MG-MCG tablet Take 1 tablet by mouth daily.        Marland Kitchen Phenyleph-Promethazine-Cod 5-6.25-10 MG/5ML SYRP Take 5 mLs by mouth every 6 (six) hours as needed (for cough).  120 mL  1  . Vitamins-Lipotropics (B-50 COMPLEX) TABS Take 1 tablet by mouth daily.        . mometasone (NASONEX) 50 MCG/ACT nasal spray Place 2 sprays into the nose at bedtime.  51 g  3   No facility-administered encounter medications on file as of 06/23/2012.    No Known Allergies   Current Medications, Allergies, Past Medical History, Past Surgical History, Family History, and Social History were reviewed in [employer] record.    Review of Systems        The patient complains of nasal congestion, cough, and some drainage.  The patient denies fever, chills, sweats, anorexia, fatigue, weakness, malaise, weight loss, sleep disorder, blurring, diplopia, eye irritation, eye discharge, vision loss, eye pain, photophobia, earache, ear discharge, tinnitus, decreased hearing, nosebleeds, sore throat, hoarseness, chest pain, palpitations, syncope, dyspnea on exertion, orthopnea, PND, peripheral edema, dyspnea at rest, excessive sputum, hemoptysis, wheezing, pleurisy, nausea, vomiting, diarrhea, constipation, change in bowel habits, abdominal pain, melena, hematochezia, jaundice, gas/bloating, indigestion/heartburn, dysphagia, odynophagia, dysuria, hematuria, urinary frequency, urinary hesitancy, nocturia, incontinence,  back pain, joint pain, joint swelling, muscle cramps, muscle weakness, stiffness, arthritis, sciatica, restless legs, leg pain at night, leg pain with exertion, rash, itching, dryness, suspicious lesions, paralysis, paresthesias, seizures, tremors, vertigo, transient blindness, frequent falls, frequent headaches, difficulty walking, depression, anxiety, memory loss, confusion, cold intolerance, heat intolerance, polydipsia, polyphagia, polyuria, unusual weight change, abnormal bruising, bleeding, enlarged lymph nodes, urticaria, and recurrent infections.     Objective:   Physical Exam     WD, WN, 57 y/o WF in NAD GENERAL:  Alert & oriented; pleasant & cooperative... HEENT:  [place]/AT, EOM-wnl, PERRLA, Fundi-benign, EACs-clear, TMs-wnl, NOSE-congested, THROAT-clear & wnl. NECK:  Supple w/ full ROM; no JVD; normal carotid impulses w/o bruits; no thyromegaly or nodules palpated; no lymphadenopathy. CHEST:  Clear to P & A; without wheezes/ rales/ or rhonchi heard... HEART:  Regular Rhythm; without murmurs/ rubs/ or gallops detected... ABDOMEN:  Soft & nontender; normal bowel sounds; no organomegaly or masses palpated... EXT: without deformities or arthritic changes; no varicose veins/ venous insuffic/ or edema. NEURO:  CN's intact; motor testing normal; sensory testing normal; gait normal & balance OK. DERM:  No lesions noted; no rash etc...  PFT:  Normal spirometry x mild small airways disease... CXR:  Clear & WNL.Marland KitchenMarland Kitchen LABS:  All essentially WNL.Marland KitchenMarland Kitchen   Assessment & Plan:    Allergic rhinitis>  As noted- rx w/ Antihist, Saline, Nasonex, etc...  Asthma>  Continue regular dosing w/ her Flovent 220- 2puffs BID, plus MucinexDM, Fluids, etc... Wrote for Phen expect w/ codeine for prn use...  Borderline Hyperchol>  on diet alone & FLP is not quite at goals- we reviewed diet/ exercise/ etc...  Hx IBS>  She is asymptomatic but needs routine colonoscopy, now age 35; she promises to sched at her  convenience...  Hx LBP>  Doing satis, exercising, walking, no difficulty at present...  Hx HAs>  Using Midrin as needed...   Patient's Medications  New Prescriptions   No medications on file  Previous Medications   ALBUTEROL (PROVENTIL HFA) 108 (90 BASE) MCG/ACT INHALER    Inhale 2 puffs into the lungs every 6 (six) hours as needed.   CETIRIZINE (ZYRTEC) 10 MG TABLET  Take 10 mg by mouth daily.     IBUPROFEN (ADVIL,MOTRIN) 200 MG TABLET    Take 200 mg by mouth every 6 (six) hours as needed.     MOMETASONE (NASONEX) 50 MCG/ACT NASAL SPRAY    Place 2 sprays into the nose at bedtime.   MULTIPLE VITAMIN (MULTIVITAMIN) TABLET    Take 1 tablet by mouth daily.     NORETHINDRONE-MESTRANOL (NECON 1/50, 28,) 1-50 MG-MCG TABLET    Take 1 tablet by mouth daily.     PHENYLEPH-PROMETHAZINE-COD 5-6.25-10 MG/5ML SYRP    Take 5 mLs by mouth every 6 (six) hours as needed (for cough).   VITAMINS-LIPOTROPICS (B-50 COMPLEX) TABS    Take 1 tablet by mouth daily.    Modified Medications   Modified Medication Previous Medication   FLUTICASONE (FLOVENT HFA) 220 MCG/ACT INHALER fluticasone (FLOVENT HFA) 220 MCG/ACT inhaler      Inhale 2 puffs into the lungs 2 (two) times daily.    Inhale 2 puffs into the lungs 2 (two) times daily.   MONTELUKAST (SINGULAIR) 10 MG TABLET montelukast (SINGULAIR) 10 MG tablet      TAKE 1 TABLET DAILY    TAKE 1 TABLET DAILY  Discontinued Medications   No medications on file

## 2012-06-25 ENCOUNTER — Telehealth: Payer: Self-pay | Admitting: Pulmonary Disease

## 2012-06-25 NOTE — Telephone Encounter (Signed)
Called and spoke with pt about her lab results per SN.  Pt voiced her understanding of these results and nothing further is needed.

## 2012-09-07 ENCOUNTER — Other Ambulatory Visit: Payer: Self-pay | Admitting: Pulmonary Disease

## 2013-04-14 ENCOUNTER — Telehealth: Payer: Self-pay | Admitting: Pulmonary Disease

## 2013-04-14 NOTE — Telephone Encounter (Signed)
I spoke with Shawna OrleansMelanie from pharmacy and she states the pt brought in a hand written Rx for Tussionex dated 04-13-13 signed by Dr. Kriste BasqueNadel. They are calling to verify this because they are not familiar with the doctor or the pt. I do not see any mention of us writing the pt an Rx for this medication. Nothing is documented in pt chart. I have asked that they fax a copy of the rx to triage fax so we can look at it and show it to SN to see if he did write this RX. Will await fax. Carron CurieJennifer Castillo, CMA

## 2013-04-14 NOTE — Telephone Encounter (Signed)
pts husband was seen in the office yesterday by SN.  Pt was given rx for tussionex and prednisone by SN yesterday.  Pt stated that [facility] and she was not getting any better.  i have called and spoke with the pharmacy and they are aware that SN did write these rx for the pt. They will get the tussionex filled for the pt.

## 2013-06-29 ENCOUNTER — Telehealth: Payer: Self-pay | Admitting: Pulmonary Disease

## 2013-06-29 NOTE — Telephone Encounter (Signed)
Pt has a recall appt to be schd.  Should someone call this patient to tell the patient to find a new doc?  Or should we schd this patient an appt.?

## 2013-06-29 NOTE — Telephone Encounter (Signed)
Please advise Leigh. Thanks.

## 2013-06-30 NOTE — Telephone Encounter (Signed)
We can schedule the appt with SN for the pt---we will need to make her aware that SN is going to be only doing pulmonary from now on, but we can still see her and fill her meds for her until she sets up with new primary care doctor.  thanks

## 2013-06-30 NOTE — Telephone Encounter (Signed)
LMTCB

## 2013-07-01 NOTE — Telephone Encounter (Signed)
Pt returning call.Carla Wong ° °

## 2013-07-01 NOTE — Telephone Encounter (Signed)
Spoke with the pt and notified of the below recs per SN Pt verbalized understanding  I have scheduled pt for f/u on 07/15/13

## 2013-07-01 NOTE — Telephone Encounter (Signed)
LMTCB X2

## 2013-07-15 ENCOUNTER — Encounter: Payer: Self-pay | Admitting: Pulmonary Disease

## 2013-07-15 ENCOUNTER — Other Ambulatory Visit (INDEPENDENT_AMBULATORY_CARE_PROVIDER_SITE_OTHER): Payer: 59

## 2013-07-15 ENCOUNTER — Ambulatory Visit (INDEPENDENT_AMBULATORY_CARE_PROVIDER_SITE_OTHER): Payer: 59 | Admitting: Pulmonary Disease

## 2013-07-15 ENCOUNTER — Encounter (INDEPENDENT_AMBULATORY_CARE_PROVIDER_SITE_OTHER): Payer: Self-pay

## 2013-07-15 VITALS — BP 110/70 | HR 60 | Temp 97.8°F | Ht 63.75 in | Wt 129.6 lb

## 2013-07-15 DIAGNOSIS — Z0000 Encounter for general adult medical examination without abnormal findings: Secondary | ICD-10-CM

## 2013-07-15 DIAGNOSIS — J45909 Unspecified asthma, uncomplicated: Secondary | ICD-10-CM

## 2013-07-15 DIAGNOSIS — J309 Allergic rhinitis, unspecified: Secondary | ICD-10-CM

## 2013-07-15 DIAGNOSIS — K589 Irritable bowel syndrome without diarrhea: Secondary | ICD-10-CM

## 2013-07-15 DIAGNOSIS — Z86010 Personal history of colonic polyps: Secondary | ICD-10-CM

## 2013-07-15 DIAGNOSIS — E785 Hyperlipidemia, unspecified: Secondary | ICD-10-CM

## 2013-07-15 LAB — BASIC METABOLIC PANEL
BUN: 11 mg/dL (ref 6–23)
CALCIUM: 9.7 mg/dL (ref 8.4–10.5)
CO2: 28 meq/L (ref 19–32)
CREATININE: 0.7 mg/dL (ref 0.4–1.2)
Chloride: 100 mEq/L (ref 96–112)
GFR: 89.89 mL/min (ref >60.00)
GLUCOSE: 90 mg/dL (ref 70–99)
Potassium: 4 mEq/L (ref 3.5–5.1)
Sodium: 138 mEq/L (ref 135–145)

## 2013-07-15 LAB — HEPATIC FUNCTION PANEL
ALBUMIN: 4.3 g/dL (ref 3.5–5.2)
ALT: 20 U/L (ref 0–35)
AST: 26 U/L (ref 0–37)
Alkaline Phosphatase: 48 U/L (ref 39–117)
Bilirubin, Direct: 0.1 mg/dL (ref 0.0–0.3)
Total Bilirubin: 1 mg/dL (ref 0.2–1.2)
Total Protein: 7.6 g/dL (ref 6.0–8.3)

## 2013-07-15 LAB — CBC WITH DIFFERENTIAL/PLATELET
Basophils Absolute: 0 10*3/uL (ref 0.0–0.1)
Basophils Relative: 0.4 % (ref 0.0–3.0)
EOS PCT: 1.3 % (ref 0.0–5.0)
Eosinophils Absolute: 0.1 10*3/uL (ref 0.0–0.7)
HCT: 45.2 % (ref 36.0–46.0)
HEMOGLOBIN: 15.2 g/dL — AB (ref 12.0–15.0)
LYMPHS ABS: 2.2 10*3/uL (ref 0.7–4.0)
LYMPHS PCT: 33.5 % (ref 12.0–46.0)
MCHC: 33.7 g/dL (ref 30.0–36.0)
MCV: 88.2 fl (ref 78.0–100.0)
MONOS PCT: 6.7 % (ref 3.0–12.0)
Monocytes Absolute: 0.4 10*3/uL (ref 0.1–1.0)
Neutro Abs: 3.9 10*3/uL (ref 1.4–7.7)
Neutrophils Relative %: 58.1 % (ref 43.0–77.0)
PLATELETS: 199 10*3/uL (ref 150.0–400.0)
RBC: 5.12 Mil/uL — ABNORMAL HIGH (ref 3.87–5.11)
RDW: 13.6 % (ref 11.5–15.5)
WBC: 6.7 10*3/uL (ref 4.0–10.5)

## 2013-07-15 LAB — LIPID PANEL
Cholesterol: 235 mg/dL — ABNORMAL HIGH (ref 0–200)
HDL: 92.3 mg/dL (ref >39.00)
LDL Cholesterol: 133 mg/dL — ABNORMAL HIGH (ref 0–99)
NONHDL: 142.7
TRIGLYCERIDES: 47 mg/dL (ref 0.0–149.0)
Total CHOL/HDL Ratio: 3
VLDL: 9.4 mg/dL (ref 0.0–40.0)

## 2013-07-15 LAB — TSH: TSH: 1.94 u[IU]/mL (ref 0.35–4.50)

## 2013-07-15 MED ORDER — FLUTICASONE PROPIONATE HFA 220 MCG/ACT IN AERO: 2.0000 | INHALATION_SPRAY | Freq: Two times a day (BID) | RESPIRATORY_TRACT | Status: DC

## 2013-07-15 MED ORDER — PHENYLEPH-PROMETHAZINE-COD 5-6.25-10 MG/5ML PO SYRP: 5.0000 mL | ORAL_SOLUTION | Freq: Four times a day (QID) | ORAL | Status: DC | PRN

## 2013-07-15 MED ORDER — MOMETASONE FUROATE 50 MCG/ACT NA SUSP: 2.0000 | Freq: Every day | NASAL | Status: DC

## 2013-07-15 MED ORDER — MONTELUKAST SODIUM 10 MG PO TABS: ORAL_TABLET | ORAL | Status: DC

## 2013-07-15 NOTE — Patient Instructions (Signed)
Today we updated your med list in our EPIC system...    Continue your current medications the same...    We refilled your meds per request...  .today we did your follow up FASTING blood work...    We will contact you w/ the results when available...   Ask DrFletcher about a baseline bone density test at your next f/u visit...  Call for any questions...  Let's plan a follow up visit in 180yr, sooner if needed for problems.Marland Kitchen..Marland Kitchen

## 2013-07-16 ENCOUNTER — Encounter: Payer: Self-pay | Admitting: Pulmonary Disease

## 2013-07-16 DIAGNOSIS — Z860100 Personal history of colon polyps, unspecified: Secondary | ICD-10-CM | POA: Insufficient documentation

## 2013-07-16 NOTE — Progress Notes (Signed)
Subjective:    Patient ID: Carla Wong, female    DOB: March 24, 1955, 58 y.o.   MRN: [id number]  HPI 58 y/o WF here for a follow up visit... she is the wife of Carla Wong & they live in [place]- but still maintain their general medical care here in [place]...  ~  September 21, 2009:  Carla Wong has had a good year- doing well w/o new complaints or concerns... she has AR & Asthma & takes Zyrtek/ Nasonex/ Singulair/ Flovent & Proventil... also uses Mucinex Prn congestion... requests 90d refill perscriptions... states she had labs done by GYN in [place]- Carla Wong, and she reports that everything was "OK"...  ~  October 05, 2010:  Yearly ROV and both pt & husb are perplexed by a cough that has developed ?over several months> dry hacking cough, comes & goes, day & night, occas light phlegm, no wheezing or SOB, not on ACE, denies any GI symptoms/ reflux/ etc;  She thinks prob related to drainage but no better w/ Zytrek, Saline, Nasonex; Mucinex OTC/ Robitussin helps a little;  She has hx Asthma on Flovent 220 & she cut down to 2spQam only; she has Proventil MDI as rescue inhaler but seldom if ever uses this; she was seen by [facility] in [facility] w/ Rx for antibiotic but no better...  We discussed further eval w/ PFT now- FVC=3.16 (99%), FEV1=2.35 (92%), %1sec=75, mid-flows=68% predicted;  CXR- clear, wnl; and Fasting LABS- all essent wnl;   We discussed management w/ increasing Asthma therapy- use the Flovent220 2puffsBid, continue Proventil Prn, continue Singulair/ Zyrtek/ Mucinex/ Saline/ etc;  AND aggressive antireflux regimen w/ Prilosec OTC 20mg  Bid ( before meals), npo after dinner in the eve, elev HOB on 6" blocks;  We will also write for a cough syrup Q6H as needed...  ~  June 23, 2012:  66mo ROV & CPX> Carla Wong & Carla Wong still reside in [facility] & she relates a hx of having the flu, very stressful time w/ mother passing away w/ a stroke, then developed left post CWP w/ radiation across to the right & down the  right arm; she went to her GP- told it was infklammed & apparently sed rate was elev (we don't have any of their data); sent to rheum w/ lots of testing & Dx w/ PMR- given low dose Pred rx ?10mg /d for 3-4wks, improved but she decided to stop the Pred on her own & weaned off (she's been off for ~61mo now); she notes symptoms come & go> we reviewed the typical course of PMR, response to Pred & method for weaning the Rx is assoc w/ sed rate- she is rec to f/u w/ rheum & ask for records to be sent for our review... We reviewed the following medical problems during today's office visit >>     AR> on OTC antihist prn, symptoms well controlled...    Asthma> on Singulair10, Flovent220-2spBid, ProventilHFA prn; she reports doing well- no asthma exac in some time, feels well, breathing good...    CHOL> on diet alone; FLP 9/12 was at goals on diet alone; Labs 6/14 showed TChol 203, TG 67, HDL 71, LDL 118 & rec to continue diet/ exercise/ etc...    GI- IBS> on Zantac prn; stable, regular BMs & no recent IBS complaints but she still hasn't had her routine screening colonoscopy & we discussed setting this up in [place]...    ?PMR> see above info regarding eval by Rheum in [facility]; advised to f/u w/ Rheum & try  to get records for us to review...    HxLBP> on OTC analgesics as needed; she is doing satis, exercising some & we reviewed back strengthening exercises etc...    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 We reviewed prob list, meds, xrays and labs> see below for updates >>3 6Aver[facility] Memorial Medical Cente108r3Patient Care Associates LLCEKentuc22k4[facility] Surgical [facility] LLCEKentuckyZ6Bear 29KSe Texas Er And HospitalEKentuckyZ6RossKentuckybuNorthridge Medica67l7Sycamore Shoals HospitalEKentuckyZ6High RKent81uFlorida Hospital OceansideEKentuckyZ6HollyKentuck74y2Charlston Area Medical CenterEKen11t7Ascent Surgery Center LLCEKentuckyZ6Ma25y4Grants Pass Surgery CenterEKentuckyZ6Lemo25n1[facility] LLCEKentuckyZ6Fairless HK15e2Naval Hospital Camp PendletonEKentuckyZ6St. KentuckyMa56C3El[facility] Surgical [facility] LLCE76K4Lexington Va Medical Center - LeestownEKentuckyZ6CorneKentucky62l6Central Connecticut Endoscopy CenterEKentuckyZ6Chevy C61h6Centura Health-Littleton Adventist HospitalEKentuckyZ6Grand KentuckyViAdvocate Condell71 6[facility] Ho68s2Community Memorial H54o5The Endoscopy Center At St Francis LLCEKent64u7Squ[facility] Peak Surgical Facility IncEKentuckyZ6T16a9Henry J. Carter Specialty HospitalEKentuckyZ6French48t5[facility] LLC19E7[facility] Surgery Center IncEKentuckyZ6Kentuc37k6Banner Page HospitalEKentuck7y5Mckenzie Memorial HospitalEKentuckyZ6ReighKentucky78t6Coral View Surgery Center LLCEKentuckyZ6KentuckyPaCarolina 58P2Fairfield Memorial HospitalEKentuckyZ6Blue KentuckyBeGlbesc LLC Dba Me73m8St Joseph'S Hospital He57a4Overlook HospitalEKentuckyZ6Kentuc21k5Peterson Rehabilitation HospitalEKentuck78y2Gastroenterology Associates PaEKentuckyZ6Pumpkin CeKentucky45n4Howard County Gastrointestinal Diagnostic Ctr LLCE28K6Baylor Heart And Vascular Cen21t4Swedish Medical Center - Redmond EdEKentuckyZ6Stepping SKentuckytoGlenwood State Hospital SchoWayne BOrlando Center For Outpatient Surgery [id number] [phone] ProwsnlParker Adventist HospitWayne BNorton Audubon [phone] or concerns... We reviewed the following medical problems during today's office visit >>     AR> on OTC Zyrtek & Nasonex; symptoms well controlled in general...    Asthma> on Singulair10, Flovent220-2spBid,  ProventilHFA prn; she reports doing well- no asthma exac in some time, feels well, breathing good...    CHOL> on diet alone; FLP 7/15 showed TChol 235, TG 47, HDL 92, LDL 133 & rec to improve her low chol/ low fat diet...    GI- IBS, POLYP> on Zantac prn; she reports having her colonoscopy 11/14 in [place]=> large sessile polyp found, repeat colon done 1/15 & was OK; we do not have any records, she was asked to repeat colon in 5yrs...    GYN- Carla Wong in [place]; he does her Mammography, PAPs, and she will inquire about baseline BMD through his office...    ?PMR> eval by Rheum in [place] 2014; advised to f/u w/ Rheum & try to get records for us to review=> we never received any notes, she is off Pred, doing satis w/o joint or muscle complaints...    HxLBP> on OTC analgesics as needed; she is doing satis, exercising some & we reviewed back strengthening exercises etc...    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 We reviewed prob list, meds, xrays and labs> see below for updates >> meds refilled per request...  LABS 7/15:  FLP- not at goals but HDL =92;  Chems- wnl;  CBC- wnl;  TSH=1.94...          Problem List:   ALLERGIC RHINITIS (ICD-477.9) - uses ZYRTEK, NASONEX, MUCINEX...  ASTHMA (ICD-493.90) - on SINGULAIR 10mg /d, PROVENTIL HFA 2sp Prn & FLOVENT 220 2puffsBid; she developed a refractory cough in 2012> ?asthma vs reflux related> treated w/ maximizing meds, antireflux regimen, cough syrup... ~  CXR 12/09 showed clear lungs w/ min apical pleural changes, NAD... ~  PFT 9/12 showed FVC=3.16 (99%), FEV1=2.35 (92%), %1sec=75, mid-flows=68% predicted... ~  CXR 9/12 showed clear lungs, WNL... ~  6/14:  Asthma is stable on singulair10, flovent220-2spBid & ProventilHFA prn...  ~  7/15: on OTC Zyrtek & Nasonex; symptoms well controlled in general...  Hx of HYPERCHOLESTEROLEMIA, BORDERLINE (ICD-272.4) - on diet alone... ~  FLP here 1/10 showed TChol 199, TG 48, HDL 70, LDL 120...  discussed diet Rx. ~  9/11: she notes labs done this yr by GYN & Chol was "OK" per pt hx... ~  FLP 9/12 showed TChol 177, TG 58, HDL 68, LDL 98 > all parameters at goal on diet alone. ~  FLP 6/14 on diet alone showed TChol 203, TG 67, HDL 71, LDL 118  ~  FLP 7/15 on diet alone showed TChol 235, TG 47, HDL 92, LDL 133... Asked to follow better low chol/ low fat diet...  IRRITABLE BOWEL SYNDROME (ICD-564.1) - mild symptoms usually related to eating the wrong  things...  COLON POLYP >>  ~  6/14:  She has still not scheduled her routine colonoscopy & we again reviewed the need for this important screening procedure... ~  She reports colonoscopy 11/14 in [employer]> large polyp found & removed; repeat colonoscopy done 1/15 & she reports OK- told to repeat colon in 5 yrs (we do not have records from GI)  Hx of BACK PAIN, LUMBAR (ICD-724.2) - uses OTC pain meds including Ibuprofen, Tylenol... ? PMR >> eval in  in 2014, we do not have records, she is off Pred & encouraged to f/u w/ rheum & get copies of records sent to [place]...  HEADACHES, HX OF (ICD-V13.8) - prev used Midrin w/ good relief, now that this is no longer avail we will switch to VICODIN for Prn use. ~  9/12:  She tells me that Midrin is once again avail & she would like this refilled...  HEALTH MAINTENANCE >> ~  GI:  She had colonoscopies 11/14 & 1/15 as above, f/u planned for 62yrs; asked to get records for [place] to review... ~  [place]; they do mammograms, PAPs, 7 she is asked to inquire about baseline BMD, or have it here... ~  Immunizations:  Reminded to get yearly seasonal Flu vaccine each fall... Had TDAP 2010...    History reviewed. No pertinent past surgical history.   Outpatient Encounter Prescriptions as of 07/15/2013  Medication Sig  . albuterol (PROVENTIL HFA) 108 (90 BASE) MCG/ACT inhaler Inhale 2 puffs into the lungs every 6 (six) hours as needed.  . cetirizine (ZYRTEC) 10 MG tablet Take 10 mg by mouth  daily.    Marland Kitchen ibuprofen (ADVIL,MOTRIN) 200 MG tablet Take 200 mg by mouth every 6 (six) hours as needed.    . mometasone (NASONEX) 50 MCG/ACT nasal spray Place 2 sprays into the nose at bedtime.  . montelukast (SINGULAIR) 10 MG tablet TAKE 1 TABLET DAILY  . Multiple Vitamin (MULTIVITAMIN) tablet Take 1 tablet by mouth daily.    Marland Kitchen Phenyleph-Promethazine-Cod 5-6.25-10 MG/5ML SYRP Take 5 mLs by mouth every 6 (six) hours as needed (for cough).  . Vitamins-Lipotropics (B-50 COMPLEX) TABS Take 1 tablet by mouth daily.    . [DISCONTINUED] mometasone (NASONEX) 50 MCG/ACT nasal spray Place 2 sprays into the nose at bedtime.  . [DISCONTINUED] montelukast (SINGULAIR) 10 MG tablet TAKE 1 TABLET DAILY  . [DISCONTINUED] Phenyleph-Promethazine-Cod 5-6.25-10 MG/5ML SYRP Take 5 mLs by mouth every 6 (six) hours as needed (for cough).  . fluticasone (FLOVENT HFA) 220 MCG/ACT inhaler Inhale 2 puffs into the lungs 2 (two) times daily.  . [DISCONTINUED] fluticasone (FLOVENT HFA) 220 MCG/ACT inhaler Inhale 2 puffs into the lungs 2 (two) times daily.  . [DISCONTINUED] montelukast (SINGULAIR) 10 MG tablet TAKE 1 TABLET DAILY  . [DISCONTINUED] Norethindrone-Mestranol (NECON 1/50, 28,) 1-50 MG-MCG tablet Take 1 tablet by mouth daily.      No Known Allergies   Current Medications, Allergies, Past Medical History, Past Surgical History, Family History, and Social History were reviewed in [employer] record.    Review of Systems        The patient complains of nasal congestion, cough, and some drainage.  The patient denies fever, chills, sweats, anorexia, fatigue, weakness, malaise, weight loss, sleep disorder, blurring, diplopia, eye irritation, eye discharge, vision loss, eye pain, photophobia, earache, ear discharge, tinnitus, decreased hearing, nosebleeds, sore throat, hoarseness, chest pain, palpitations, syncope, dyspnea on exertion, orthopnea, PND, peripheral edema, dyspnea at rest, excessive  sputum, hemoptysis, wheezing, pleurisy, nausea, vomiting, diarrhea, constipation, change in bowel habits, abdominal pain, melena, hematochezia, jaundice, gas/bloating, indigestion/heartburn, dysphagia, odynophagia, dysuria, hematuria, urinary frequency, urinary hesitancy, nocturia, incontinence, back pain, joint pain, joint swelling, muscle cramps, muscle weakness, stiffness, arthritis, sciatica, restless legs, leg pain at night, leg pain with exertion, rash, itching, dryness, suspicious lesions, paralysis, paresthesias, seizures, tremors, vertigo, transient blindness, frequent falls, frequent headaches, difficulty walking, depression, anxiety, memory loss, confusion, cold intolerance, heat intolerance, polydipsia, polyphagia, polyuria, unusual weight change, abnormal bruising, bleeding, enlarged lymph nodes, urticaria, and recurrent infections.     Objective:   Physical Exam     WD, WN, 58 y/o WF in [place] GENERAL:  Alert & oriented; pleasant & cooperative... HEENT:  /AT, EOM-wnl, PERRLA, Fundi-benign, EACs-clear, TMs-wnl, NOSE-congested, THROAT-clear & wnl. NECK:  Supple w/ full ROM; no JVD; normal carotid impulses w/o bruits; no thyromegaly or nodules palpated; no lymphadenopathy. CHEST:  Clear to P & A; without wheezes/ rales/ or rhonchi heard... HEART:  Regular Rhythm; without murmurs/ rubs/ or gallops detected... ABDOMEN:  Soft & nontender; normal bowel sounds; no organomegaly or masses palpated... EXT: without deformities or arthritic changes; no varicose veins/ venous insuffic/ or edema. NEURO:  CN's intact; motor testing normal; sensory testing normal; gait normal & balance OK. DERM:  No lesions noted; no rash etc...   Assessment & Plan:    Allergic rhinitis>  As noted- rx w/ Antihist, Saline, Nasonex, etc...  Asthma>  Continue regular dosing w/ her Flovent 220- 2puffs BID, plus MucinexDM, Fluids, etc... Wrote for Phen expect w/ codeine for prn use...  Hyperchol>  on diet alone & FLP  is not at goals but HDL is excellent- we reviewed diet/ exercise/ etc...  Hx IBS>  She is asymptomatic but needs routine colonoscopy, now age 58; she promises to sched at her convenience...  Hx LBP>  Doing satis, exercising, walking, no difficulty at present...  Hx HAs>  Using Midrin as needed...   Patient's Medications  New Prescriptions   No medications on file  Previous Medications   ALBUTEROL (PROVENTIL HFA) 108 (90 BASE) MCG/ACT INHALER    Inhale 2 puffs into the lungs every 6 (six) hours as needed.   CETIRIZINE (ZYRTEC) 10 MG TABLET    Take 10 mg by mouth daily.     IBUPROFEN (ADVIL,MOTRIN) 200 MG TABLET    Take 200 mg by mouth every 6 (six) hours as needed.     MULTIPLE VITAMIN (MULTIVITAMIN) TABLET    Take 1 tablet by mouth daily.     VITAMINS-LIPOTROPICS (B-50 COMPLEX) TABS    Take 1 tablet by mouth daily.    Modified Medications   Modified Medication Previous Medication   FLUTICASONE (FLOVENT HFA) 220 MCG/ACT INHALER fluticasone (FLOVENT HFA) 220 MCG/ACT inhaler      Inhale 2 puffs into the lungs 2 (two) times daily.    Inhale 2 puffs into the lungs 2 (two) times daily.   MOMETASONE (NASONEX) 50 MCG/ACT NASAL SPRAY mometasone (NASONEX) 50 MCG/ACT nasal spray      Place 2 sprays into the nose at bedtime.    Place 2 sprays into the nose at bedtime.   MONTELUKAST (SINGULAIR) 10 MG TABLET montelukast (SINGULAIR) 10 MG tablet      TAKE 1 TABLET DAILY    TAKE 1 TABLET DAILY   PHENYLEPH-PROMETHAZINE-COD 5-6.25-10 MG/5ML SYRP Phenyleph-Promethazine-Cod 5-6.25-10 MG/5ML SYRP      Take 5 mLs by mouth every 6 (six) hours as needed (for cough).    Take 5  mLs by mouth every 6 (six) hours as needed (for cough).  Discontinued Medications   MONTELUKAST (SINGULAIR) 10 MG TABLET    TAKE 1 TABLET DAILY   NORETHINDRONE-MESTRANOL (NECON 1/50, 28,) 1-50 MG-MCG TABLET    Take 1 tablet by mouth daily.

## 2013-08-19 ENCOUNTER — Telehealth: Payer: Self-pay | Admitting: Pulmonary Disease

## 2013-08-19 MED ORDER — AZITHROMYCIN 250 MG PO TABS: 250.0000 mg | ORAL_TABLET | ORAL | Status: DC

## 2013-08-19 NOTE — Telephone Encounter (Signed)
Pt aware that Rx being [facility] to [facility] [place] [facility] Nothing further needed.

## 2013-08-19 NOTE — Telephone Encounter (Signed)
Per SN---  Ok to call in  zpak #1  Take as directed with 2 refills.  thanks

## 2013-08-19 NOTE — Telephone Encounter (Signed)
Spoke with patient-states she continues to have cough and congestion as she did when she was here with her husband for his appt with SN-states SN gave her a Zpak rx to take if she did not get better. Pt ended up taking the Rx and not better. Pt would like us to [facility] another round [facility] [facility]. Pt denies any fever or chills. SN please advise. Thanks.

## 2013-08-25 ENCOUNTER — Telehealth: Payer: Self-pay | Admitting: Pulmonary Disease

## 2013-08-25 MED ORDER — PREDNISONE (PAK) 5 MG PO TABS: ORAL_TABLET | ORAL | Status: DC

## 2013-08-25 NOTE — Telephone Encounter (Signed)
Pt states that she finished Zpak yesterday and still has rattle in chest, prod cough clear).  Took Delsym this morning and has Promethazine with Codeine for night time use.  Pt states that she has refills on Zpak and wanted to know if she should go ahead and take another course.  Please advise.  No Known Allergies

## 2013-08-25 NOTE — Telephone Encounter (Signed)
Per SN--  Ok to call in prednisone dosepak #1  Take as directed.  i have called the pt and she is aware and nothing further is needed.

## 2014-07-19 ENCOUNTER — Telehealth: Payer: Self-pay | Admitting: Pulmonary Disease

## 2014-07-19 DIAGNOSIS — E7800 Pure hypercholesterolemia, unspecified: Secondary | ICD-10-CM

## 2014-07-19 NOTE — Telephone Encounter (Signed)
lmomtcb x1 Pt does not have appt scheduled

## 2014-07-21 NOTE — Telephone Encounter (Signed)
Spoke with pt. She DOES NOT have an appointment on 7/22 her husband does. While she is here with him she wants to have her cholesterol checked.  SN - are you okay with this? Thanks.

## 2014-07-21 NOTE — Telephone Encounter (Signed)
lmtcb x2 for pt. 

## 2014-07-21 NOTE — Telephone Encounter (Signed)
Per SN, ok to order FLP on pt  Pt called and message left notifying of lab order  Nothing further is needed.

## 2014-07-28 ENCOUNTER — Other Ambulatory Visit (INDEPENDENT_AMBULATORY_CARE_PROVIDER_SITE_OTHER): Payer: Self-pay

## 2014-07-28 DIAGNOSIS — E7800 Pure hypercholesterolemia, unspecified: Secondary | ICD-10-CM

## 2014-07-28 LAB — LIPID PANEL
CHOLESTEROL: 226 mg/dL — AB (ref 0–200)
HDL: 70.5 mg/dL (ref >39.00)
LDL Cholesterol: 144 mg/dL — ABNORMAL HIGH (ref 0–99)
NONHDL: 155.5
Total CHOL/HDL Ratio: 3
Triglycerides: 60 mg/dL (ref 0.0–149.0)
VLDL: 12 mg/dL (ref 0.0–40.0)

## 2014-09-13 ENCOUNTER — Other Ambulatory Visit: Payer: Self-pay | Admitting: Pulmonary Disease

## 2014-09-13 MED ORDER — MONTELUKAST SODIUM 10 MG PO TABS: ORAL_TABLET | ORAL | Status: DC

## 2014-09-13 NOTE — Telephone Encounter (Signed)
Received paper [facility]ill [facility] from [facility] for pt's singulair Medication refilled with no additional refills Pt needs to schedule appt for additional refills

## 2014-12-09 ENCOUNTER — Other Ambulatory Visit: Payer: Self-pay | Admitting: Pulmonary Disease

## 2014-12-25 ENCOUNTER — Telehealth: Payer: Self-pay | Admitting: Pulmonary Disease

## 2014-12-25 MED ORDER — MONTELUKAST SODIUM 10 MG PO TABS: ORAL_TABLET | ORAL | Status: DC

## 2014-12-25 NOTE — Telephone Encounter (Signed)
Patient calling to get refill on Singulair. Patient has appointment scheduled on 1/19 to see Dr. Kriste BasqueNadel. Rx sent to pharmacy. Patient aware. Nothing further needed. Closing encounter

## 2015-01-25 ENCOUNTER — Other Ambulatory Visit (INDEPENDENT_AMBULATORY_CARE_PROVIDER_SITE_OTHER): Payer: 59

## 2015-01-25 ENCOUNTER — Encounter: Payer: Self-pay | Admitting: Pulmonary Disease

## 2015-01-25 ENCOUNTER — Ambulatory Visit (INDEPENDENT_AMBULATORY_CARE_PROVIDER_SITE_OTHER): Payer: 59 | Admitting: Pulmonary Disease

## 2015-01-25 ENCOUNTER — Ambulatory Visit (INDEPENDENT_AMBULATORY_CARE_PROVIDER_SITE_OTHER)
Admission: RE | Admit: 2015-01-25 | Discharge: 2015-01-25 | Disposition: A | Discharge status: Home / Self Care | Payer: 59 | Source: Ambulatory Visit | Attending: Pulmonary Disease | Admitting: Pulmonary Disease

## 2015-01-25 VITALS — BP 128/64 | HR 60 | Temp 97.7°F | Ht 64.0 in | Wt 132.0 lb

## 2015-01-25 DIAGNOSIS — J4520 Mild intermittent asthma, uncomplicated: Secondary | ICD-10-CM | POA: Diagnosis not present

## 2015-01-25 DIAGNOSIS — Z0000 Encounter for general adult medical examination without abnormal findings: Secondary | ICD-10-CM | POA: Diagnosis not present

## 2015-01-25 DIAGNOSIS — J301 Allergic rhinitis due to pollen: Secondary | ICD-10-CM | POA: Diagnosis not present

## 2015-01-25 DIAGNOSIS — Z23 Encounter for immunization: Secondary | ICD-10-CM | POA: Diagnosis not present

## 2015-01-25 LAB — LIPID PANEL
Cholesterol: 256 mg/dL — ABNORMAL HIGH (ref 0–200)
HDL: 79.5 mg/dL (ref >39.00)
LDL Cholesterol: 166 mg/dL — ABNORMAL HIGH (ref 0–99)
NONHDL: 176.35
TRIGLYCERIDES: 52 mg/dL (ref 0.0–149.0)
Total CHOL/HDL Ratio: 3
VLDL: 10.4 mg/dL (ref 0.0–40.0)

## 2015-01-25 LAB — CBC WITH DIFFERENTIAL/PLATELET
BASOS ABS: 0 10*3/uL (ref 0.0–0.1)
Basophils Relative: 0.5 % (ref 0.0–3.0)
EOS PCT: 2.8 % (ref 0.0–5.0)
Eosinophils Absolute: 0.1 10*3/uL (ref 0.0–0.7)
HCT: 43.8 % (ref 36.0–46.0)
HEMOGLOBIN: 14.7 g/dL (ref 12.0–15.0)
LYMPHS ABS: 1.8 10*3/uL (ref 0.7–4.0)
LYMPHS PCT: 38.8 % (ref 12.0–46.0)
MCHC: 33.6 g/dL (ref 30.0–36.0)
MCV: 87.8 fl (ref 78.0–100.0)
MONOS PCT: 8.9 % (ref 3.0–12.0)
Monocytes Absolute: 0.4 10*3/uL (ref 0.1–1.0)
NEUTROS PCT: 49 % (ref 43.0–77.0)
Neutro Abs: 2.3 10*3/uL (ref 1.4–7.7)
Platelets: 154 10*3/uL (ref 150.0–400.0)
RBC: 4.99 Mil/uL (ref 3.87–5.11)
RDW: 13.2 % (ref 11.5–15.5)
WBC: 4.7 10*3/uL (ref 4.0–10.5)

## 2015-01-25 LAB — HEPATIC FUNCTION PANEL
ALT: 16 U/L (ref 0–35)
AST: 20 U/L (ref 0–37)
Albumin: 4.2 g/dL (ref 3.5–5.2)
Alkaline Phosphatase: 41 U/L (ref 39–117)
BILIRUBIN DIRECT: 0.1 mg/dL (ref 0.0–0.3)
BILIRUBIN TOTAL: 0.5 mg/dL (ref 0.2–1.2)
Total Protein: 7.1 g/dL (ref 6.0–8.3)

## 2015-01-25 LAB — BASIC METABOLIC PANEL
BUN: 13 mg/dL (ref 6–23)
CALCIUM: 9.1 mg/dL (ref 8.4–10.5)
CO2: 31 meq/L (ref 19–32)
CREATININE: 0.75 mg/dL (ref 0.40–1.20)
Chloride: 103 mEq/L (ref 96–112)
GFR: 83.94 mL/min (ref >60.00)
Glucose, Bld: 94 mg/dL (ref 70–99)
Potassium: 4.1 mEq/L (ref 3.5–5.1)
Sodium: 141 mEq/L (ref 135–145)

## 2015-01-25 LAB — TSH: TSH: 1.91 u[IU]/mL (ref 0.35–4.50)

## 2015-01-25 LAB — VITAMIN D 25 HYDROXY (VIT D DEFICIENCY, FRACTURES): VITD: 35.56 ng/mL (ref 30.00–100.00)

## 2015-01-25 MED ORDER — MOMETASONE FUROATE 50 MCG/ACT NA SUSP: 2.0000 | Freq: Every day | NASAL | Status: AC

## 2015-01-25 MED ORDER — MONTELUKAST SODIUM 10 MG PO TABS: ORAL_TABLET | ORAL | Status: DC

## 2015-01-25 NOTE — Patient Instructions (Signed)
Today we updated your med list in our EPIC system...    Continue your current medications the same...    We refilled the meds you requested...  Today we did your follow up CXR, EKG, Fasting blood work...    We will contact you w/ the results when available...   We gave you the 2016 Flu vaccine...  Call for any questions or if we can be of service in any way...  Let's plan a follow up visit in 47yr, sooner if needed for problems.Marland KitchenMarland Kitchen

## 2015-02-09 ENCOUNTER — Telehealth: Payer: Self-pay | Admitting: Pulmonary Disease

## 2015-02-09 MED ORDER — FLUTICASONE PROPIONATE HFA 220 MCG/ACT IN AERO: 2.0000 | INHALATION_SPRAY | Freq: Two times a day (BID) | RESPIRATORY_TRACT | Status: DC

## 2015-02-09 NOTE — Telephone Encounter (Signed)
Called pt. [facility] I have [facility] so. Nothing further needed

## 2015-05-07 ENCOUNTER — Telehealth: Payer: Self-pay | Admitting: Pulmonary Disease

## 2015-05-07 NOTE — Telephone Encounter (Signed)
Spoke with pt, states she is wanting to have her cholesterol checked.  Pt lives in [place], and is going out of town on a cruise on 05/17/15-would like this checked this week so she can pick up any medications if needed.  Pt states SN has sent them to a place in [place] before that was able to route the lab results directly to SN.   SN please advise.  Thanks!

## 2015-05-08 NOTE — Telephone Encounter (Signed)
Per SN: I recommend to look up "[facility]", a company in [place]. There is a company on [address] [place]. Pt can walk in to facility when she is fasting and can draw a FLP for $49 out of pocket and have the results sent to her and then forwarded to SN.   Called and spoke to pt. Informed her of the recs per SN. Pt verbalized understanding but has decided to get [place] [facility] and fax the results to our office. Front fax number given to pt. Nothing further needed at this time.

## 2015-05-17 NOTE — Progress Notes (Addendum)
Subjective:    Patient ID: Carla Wong, female    DOB: 1955/09/12, 60 y.o.   MRN: [id number]  HPI 60 y/o WF here for a follow up visit... she is the wife of Delila Kuklinski & they live in [place], [place]- but still maintain their general medical care here in [place]... 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 We reviewed the following medical problems during today's office visit >>     AR> on OTC antihist prn, symptoms well controlled...    Asthma> on Singulair10, Flovent220-2spBid, ProventilHFA prn; she reports doing well- no asthma exac in some time, feels well, breathing good...    CHOL> on diet alone; FLP 9/12 was at goals on diet alone; Labs 6/14 showed TChol 203, TG 67, HDL 71, LDL 118 & rec to continue diet/ exercise/ etc...    GI- IBS> on Zantac prn; stable, regular BMs & no recent IBS complaints but she still hasn't had her routine screening colonoscopy & we discussed setting this up in [place]...    ?PMR> see above info regarding eval by Rheum in [facility]; advised to f/u w/ Rheum & try to get records for [place] to review...    HxLBP> on OTC analgesics as needed; she is doing satis, exercising some & we reviewed back strengthening exercises etc...    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 We reviewed prob list, meds, xrays and labs> see below for updates >>   LABS 6/14:  She had extensive labs done in [facility] recently> we did FLP w/ results above...  ~  July 15, 2013:  56mo ROV & CPX> Via returns for a yearly ROV/ CPX; she reports a good yr overall- no new complaints or concerns... We reviewed the following medical problems during today's office visit >>     AR> on OTC Zyrtek & Nasonex; symptoms well controlled in general...    Asthma> on Singulair10, Flovent220-2spBid, ProventilHFA prn; she reports doing well- no asthma exac in some time, feels well, breathing good...    CHOL> on diet alone; FLP 7/15 showed TChol 235, TG 47, HDL 92, LDL 133 & rec to improve her low chol/ low fat diet...    GI- IBS, POLYP> on Zantac prn; she reports having her colonoscopy 11/14 in [place]=> large sessile polyp found, repeat colon done 1/15 & was OK; we do not have any records, she was asked to repeat colon in 62yrs...    GYN- DrFletcher in [place]; he does her Mammography, PAPs, and she will inquire about baseline  BMD through his office...    ?PMR> eval by Rheum in [facility] 2014; advised to f/u w/ Rheum & try to get records for [place] to review=> we never received any notes, she is off Pred, doing satis w/o joint or muscle complaints...    HxLBP> on OTC analgesics as needed; she is doing satis, exercising some & we reviewed back strengthening exercises etc...    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 We reviewed prob list, meds, xrays and labs> see below for updates >> meds refilled per request...  LABS 7/15:  FLP- not at goals but HDL =92;  Chems- wnl;  CBC- wnl;  TSH=1.94...  ~  January 25, 2015:  57mo ROV & CPX>  Isma continues to do well- no new complaints or concerns;  Her LMD in [place] treated her for a URI around Christmas w/ an antibiotic & resolved, no resp symptoms- denies f/c/s, cough/ sput, SOB or chest  discomfort... We reviewed the following medical problems during today's office visit >>     AR> on OTC Zyrtek & Nasonex; symptoms well controlled in general...    Asthma> on Singulair10, Flovent220-2spBid, ProventilHFA prn (hasn't needed); she reports doing well- no asthma exac in some time, feels well, breathing good...    CHOL> on diet alone; FLP 7/15 showed TChol 235, TG 47, HDL 92, LDL 133 & rec to improve her low chol/ low fat diet; FLP 1/17 showed TChol 266, TG 52, HDL 80, LDL 166 & she wants 1 more chance for diet rx!    GI- IBS, POLYP> on Zantac prn; she reports having her colonoscopy 11/14 in [place]=> large sessile polyp found, repeat colon done 1/15 & was OK; we do not have any records, she was asked to repeat colon in 46yrs...    GYN- DrFletcher in [place]; he does her Mammography, PAPs, and she will inquire about baseline BMD through his office...    ?PMR> eval by Rheum in [facility] 2014; advised to f/u w/ Rheum & try to get records for [place] to review=> we never received any notes, she is off Pred, doing satis w/o joint or muscle complaints...    HxLBP> on OTC analgesics as needed; she is doing satis, exercising some & we reviewed back strengthening exercises etc...    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 EXAM shows Afeb, VSS, O2sat=98% on RA at rest;  HEENT- neg, mallampati1;  Chest- Clear w/o w/r/r;  Heart- RR w/o m/r/g;  Abd- soft, neg, nontender;  Ext- neg w/o c/c/e;  Neuro- intact...  CXR 01/25/15>  Norm heart size, clear lungs, NAD.Marland KitchenMarland Kitchen  EKG 01/25/15>  SBrady, rate57, wnl, NAD...  LABS 01/25/15>  FLP not at goals w/ TChol266/ LDL166 on diet alone;  Chems- wnl;  CBC- wnl;  TSH=1.91;  VitD=36 IMP/PLAN>>  [name] is doing well clinically but her FLP continues to trendin the wrong direction; she is adamant that she can get her numbers to goal on diet/ exercise/ without meds & wants to give it one more try; we discussed getting FLP in [place] in several months & sending it to me to  review... Given 2016 Flu vaccine today  ADDENDUM>>  Pt had FLP checked 05/11/15 in >>  TChol 194, TG 65, HDL 70, LDL 111... GREAT JOB on diet alone, keep up the good work...           Problem List:   ALLERGIC RHINITIS (ICD-477.9) - uses ZYRTEK,  NASONEX, MUCINEX...  ASTHMA (ICD-493.90) - on SINGULAIR 10mg /d, PROVENTIL HFA 2sp Prn & FLOVENT 220 2puffsBid; she developed a refractory cough in 2012> ?asthma vs reflux related> treated w/ maximizing meds, antireflux regimen, cough syrup... ~  CXR 12/09 showed clear lungs w/ min apical pleural changes, NAD... ~  PFT 9/12 showed FVC=3.16 (99%), FEV1=2.35 (92%), %1sec=75, mid-flows=68% predicted... ~  CXR 9/12 showed clear lungs, WNL... ~  6/14:  Asthma is stable on singulair10, flovent220-2spBid & ProventilHFA prn...  ~  7/15: on OTC Zyrtek & Nasonex; symptoms well controlled in general...  Hx of HYPERCHOLESTEROLEMIA, BORDERLINE (ICD-272.4) - on diet alone... ~  FLP here 1/10 showed TChol 199, TG 48, HDL 70, LDL 120... discussed diet Rx. ~  9/11: she notes labs done this yr by GYN & Chol was "OK" per pt hx... ~  FLP 9/12 showed TChol 177, TG 58, HDL 68, LDL 98 > all parameters at goal on diet alone. ~  FLP 6/14 on diet alone showed TChol 203, TG 67, HDL 71, LDL 118  ~  FLP 7/15 on diet alone showed TChol 235, TG 47, HDL 92, LDL 133... Asked to follow better low chol/ low fat diet...  IRRITABLE BOWEL SYNDROME (ICD-564.1) - mild symptoms usually related to eating the wrong things...  COLON POLYP >>  ~  6/14:  She has still not scheduled her routine colonoscopy & we again reviewed the need for this important screening procedure... ~  She reports colonoscopy 11/14 in [place]> large polyp found & removed; repeat colonoscopy done 1/15 & she reports OK- told to repeat colon in 5 yrs (we do not have records from GI)  Hx of BACK PAIN, LUMBAR (ICD-724.2) - uses OTC pain meds including Ibuprofen, Tylenol... ? PMR >> eval in [place] in 2014, we do not have records,  she is off Pred & encouraged to f/u w/ rheum & get copies of records sent to us...  HEADACHES, HX OF (ICD-V13.8) - prev used Midrin w/ good relief, now that this is no longer avail we will switch to VICODIN for Prn use. ~  9/12:  She tells me that Midrin is once again avail & she would like this refilled...  HEALTH MAINTENANCE >> ~  GI:  She had colonoscopies 11/14 & 1/15 as above, f/u planned for 5258yrs; asked to get records for us to review... ~  GYN= [place]Fletcher in [place] on topical meds; they do mammograms, PAPs, 7 she is asked to inquire about baseline BMD, or have it here... ~  Immunizations:  Reminded to get yearly seasonal Flu vaccine each fall... Had TDAP 2010...    No past surgical history on file.   Outpatient Encounter Prescriptions as of 01/25/2015  Medication Sig  . albuterol (PROVENTIL HFA) 108 (90 BASE) MCG/ACT inhaler Inhale 2 puffs into the lungs every 6 (six) hours as needed.  . cetirizine (ZYRTEC) 10 MG tablet Take 10 mg by mouth daily.    Marland Kitchen. ibuprofen (ADVIL,MOTRIN) 200 MG tablet Take 200 mg by mouth every 6 (six) hours as needed.    . mometasone (NASONEX) 50 MCG/ACT nasal spray Place 2 sprays into the nose at bedtime.  . montelukast (SINGULAIR) 10 MG tablet TAKE 1 TABLET DAILY  . Multiple Vitamin (MULTIVITAMIN) tablet Take 1 tablet by mouth daily.    . Vitamins-Lipotropics (B-50 COMPLEX) TABS Take 1 tablet by mouth daily.    . [DISCONTINUED] mometasone (NASONEX) 50 MCG/ACT nasal spray Place 2 sprays into the nose at bedtime.  . [DISCONTINUED] montelukast (SINGULAIR) 10  MG tablet TAKE 1 TABLET DAILY  . [DISCONTINUED] azithromycin (ZITHROMAX) 250 MG tablet Take 1 tablet (250 mg total) by mouth as directed. (Patient not taking: Reported on 01/25/2015)  . [DISCONTINUED] fluticasone (FLOVENT HFA) 220 MCG/ACT inhaler Inhale 2 puffs into the lungs 2 (two) times daily.  . [DISCONTINUED] Phenyleph-Promethazine-Cod 5-6.25-10 MG/5ML SYRP Take 5 mLs by mouth every 6 (six) hours as needed  (for cough). (Patient not taking: Reported on 01/25/2015)  . [DISCONTINUED] predniSONE (STERAPRED UNI-PAK) 5 MG TABS tablet Take as directed (Patient not taking: Reported on 01/25/2015)   No facility-administered encounter medications on file as of 01/25/2015.    No Known Allergies    Immunization History  Administered Date(s) Administered  . Influenza Split 10/04/2010  . Influenza,inj,Quad PF,36+ Mos 01/25/2015  . Tdap 09/06/2008    Current Medications, Allergies, Past Medical History, Past Surgical History, Family History, and Social History were reviewed in [employer] record.    Review of Systems        The patient complains of nasal congestion, cough, and some drainage.  The patient denies fever, chills, sweats, anorexia, fatigue, weakness, malaise, weight loss, sleep disorder, blurring, diplopia, eye irritation, eye discharge, vision loss, eye pain, photophobia, earache, ear discharge, tinnitus, decreased hearing, nosebleeds, sore throat, hoarseness, chest pain, palpitations, syncope, dyspnea on exertion, orthopnea, PND, peripheral edema, dyspnea at rest, excessive sputum, hemoptysis, wheezing, pleurisy, nausea, vomiting, diarrhea, constipation, change in bowel habits, abdominal pain, melena, hematochezia, jaundice, gas/bloating, indigestion/heartburn, dysphagia, odynophagia, dysuria, hematuria, urinary frequency, urinary hesitancy, nocturia, incontinence, back pain, joint pain, joint swelling, muscle cramps, muscle weakness, stiffness, arthritis, sciatica, restless legs, leg pain at night, leg pain with exertion, rash, itching, dryness, suspicious lesions, paralysis, paresthesias, seizures, tremors, vertigo, transient blindness, frequent falls, frequent headaches, difficulty walking, depression, anxiety, memory loss, confusion, cold intolerance, heat intolerance, polydipsia, polyphagia, polyuria, unusual weight change, abnormal bruising, bleeding, enlarged lymph nodes,  urticaria, and recurrent infections.     Objective:   Physical Exam     WD, WN, 60 y/o WF in NAD GENERAL:  Alert & oriented; pleasant & cooperative... HEENT:  [place]/AT, EOM-wnl, PERRLA, Fundi-benign, EACs-clear, TMs-wnl, NOSE-congested, THROAT-clear & wnl. NECK:  Supple w/ full ROM; no JVD; normal carotid impulses w/o bruits; no thyromegaly or nodules palpated; no lymphadenopathy. CHEST:  Clear to P & A; without wheezes/ rales/ or rhonchi heard... HEART:  Regular Rhythm; without murmurs/ rubs/ or gallops detected... ABDOMEN:  Soft & nontender; normal bowel sounds; no organomegaly or masses palpated... EXT: without deformities or arthritic changes; no varicose veins/ venous insuffic/ or edema. NEURO:  CN's intact; motor testing normal; sensory testing normal; gait normal & balance OK. DERM:  No lesions noted; no rash etc...   Assessment & Plan:    Allergic rhinitis>  As noted- rx w/ Antihist, Saline, Nasonex, etc...  Asthma>  Continue regular dosing w/ her Flovent 220- 2puffs BID, plus MucinexDM, Fluids, etc...  Hyperchol>  on diet alone & FLP is not at goals but HDL is excellent- we reviewed diet/ exercise/ etc...  Hx IBS, colon polyps>  She is asymptomatic & colonoscopies followed by GI in [place]...  Hx LBP>  Doing satis, exercising, walking, no difficulty at present...  Hx HAs>  Using Midrin as needed...   Patient's Medications  New Prescriptions   No medications on file  Previous Medications   ALBUTEROL (PROVENTIL HFA) 108 (90 BASE) MCG/ACT INHALER    Inhale 2 puffs into the lungs every 6 (six) hours as needed.   CETIRIZINE (ZYRTEC)  10 MG TABLET    Take 10 mg by mouth daily.     IBUPROFEN (ADVIL,MOTRIN) 200 MG TABLET    Take 200 mg by mouth every 6 (six) hours as needed.     MULTIPLE VITAMIN (MULTIVITAMIN) TABLET    Take 1 tablet by mouth daily.     VITAMINS-LIPOTROPICS (B-50 COMPLEX) TABS    Take 1 tablet by mouth daily.    Modified Medications   Modified Medication Previous  Medication   FLUTICASONE (FLOVENT HFA) 220 MCG/ACT INHALER fluticasone (FLOVENT HFA) 220 MCG/ACT inhaler      Inhale 2 puffs into the lungs 2 (two) times daily.    Inhale 2 puffs into the lungs 2 (two) times daily.   MOMETASONE (NASONEX) 50 MCG/ACT NASAL SPRAY mometasone (NASONEX) 50 MCG/ACT nasal spray      Place 2 sprays into the nose at bedtime.    Place 2 sprays into the nose at bedtime.   MONTELUKAST (SINGULAIR) 10 MG TABLET montelukast (SINGULAIR) 10 MG tablet      TAKE 1 TABLET DAILY    TAKE 1 TABLET DAILY  Discontinued Medications   AZITHROMYCIN (ZITHROMAX) 250 MG TABLET    Take 1 tablet (250 mg total) by mouth as directed.   PHENYLEPH-PROMETHAZINE-COD 5-6.25-10 MG/5ML SYRP    Take 5 mLs by mouth every 6 (six) hours as needed (for cough).   PREDNISONE (STERAPRED UNI-PAK) 5 MG TABS TABLET    Take as directed

## 2015-12-06 ENCOUNTER — Other Ambulatory Visit: Payer: Self-pay | Admitting: Pulmonary Disease

## 2016-01-29 ENCOUNTER — Other Ambulatory Visit: Payer: Self-pay | Admitting: Pulmonary Disease

## 2016-01-30 ENCOUNTER — Ambulatory Visit (INDEPENDENT_AMBULATORY_CARE_PROVIDER_SITE_OTHER): Payer: 59 | Admitting: Pulmonary Disease

## 2016-01-30 ENCOUNTER — Other Ambulatory Visit (INDEPENDENT_AMBULATORY_CARE_PROVIDER_SITE_OTHER): Payer: 59

## 2016-01-30 VITALS — BP 110/80 | HR 65 | Temp 97.2°F | Ht 64.0 in | Wt 133.1 lb

## 2016-01-30 DIAGNOSIS — Z0000 Encounter for general adult medical examination without abnormal findings: Secondary | ICD-10-CM

## 2016-01-30 DIAGNOSIS — Z23 Encounter for immunization: Secondary | ICD-10-CM | POA: Diagnosis not present

## 2016-01-30 DIAGNOSIS — M353 Polymyalgia rheumatica: Secondary | ICD-10-CM

## 2016-01-30 DIAGNOSIS — J301 Allergic rhinitis due to pollen: Secondary | ICD-10-CM | POA: Diagnosis not present

## 2016-01-30 DIAGNOSIS — E7800 Pure hypercholesterolemia, unspecified: Secondary | ICD-10-CM | POA: Diagnosis not present

## 2016-01-30 DIAGNOSIS — M8580 Other specified disorders of bone density and structure, unspecified site: Secondary | ICD-10-CM | POA: Diagnosis not present

## 2016-01-30 DIAGNOSIS — J4520 Mild intermittent asthma, uncomplicated: Secondary | ICD-10-CM

## 2016-01-30 LAB — CBC WITH DIFFERENTIAL/PLATELET
BASOS ABS: 0 10*3/uL (ref 0.0–0.1)
Basophils Relative: 0.5 % (ref 0.0–3.0)
EOS ABS: 0.1 10*3/uL (ref 0.0–0.7)
Eosinophils Relative: 2.6 % (ref 0.0–5.0)
HEMATOCRIT: 42.8 % (ref 36.0–46.0)
Hemoglobin: 14.9 g/dL (ref 12.0–15.0)
LYMPHS ABS: 1.6 10*3/uL (ref 0.7–4.0)
LYMPHS PCT: 33.8 % (ref 12.0–46.0)
MCHC: 34.8 g/dL (ref 30.0–36.0)
MCV: 87.4 fl (ref 78.0–100.0)
Monocytes Absolute: 0.4 10*3/uL (ref 0.1–1.0)
Monocytes Relative: 8.5 % (ref 3.0–12.0)
NEUTROS ABS: 2.6 10*3/uL (ref 1.4–7.7)
NEUTROS PCT: 54.6 % (ref 43.0–77.0)
Platelets: 176 10*3/uL (ref 150.0–400.0)
RBC: 4.9 Mil/uL (ref 3.87–5.11)
RDW: 12.8 % (ref 11.5–15.5)
WBC: 4.7 10*3/uL (ref 4.0–10.5)

## 2016-01-30 LAB — SEDIMENTATION RATE: SED RATE: 4 mm/h (ref 0–30)

## 2016-01-30 LAB — COMPREHENSIVE METABOLIC PANEL
ALT: 16 U/L (ref 0–35)
AST: 18 U/L (ref 0–37)
Albumin: 4.4 g/dL (ref 3.5–5.2)
Alkaline Phosphatase: 37 U/L — ABNORMAL LOW (ref 39–117)
BUN: 14 mg/dL (ref 6–23)
CO2: 32 meq/L (ref 19–32)
Calcium: 9.5 mg/dL (ref 8.4–10.5)
Chloride: 104 mEq/L (ref 96–112)
Creatinine, Ser: 0.8 mg/dL (ref 0.40–1.20)
GFR: 77.65 mL/min (ref >60.00)
GLUCOSE: 99 mg/dL (ref 70–99)
Potassium: 4.1 mEq/L (ref 3.5–5.1)
SODIUM: 141 meq/L (ref 135–145)
TOTAL PROTEIN: 7.4 g/dL (ref 6.0–8.3)
Total Bilirubin: 0.6 mg/dL (ref 0.2–1.2)

## 2016-01-30 LAB — LIPID PANEL
CHOL/HDL RATIO: 3
Cholesterol: 225 mg/dL — ABNORMAL HIGH (ref 0–200)
HDL: 72.3 mg/dL (ref >39.00)
LDL Cholesterol: 143 mg/dL — ABNORMAL HIGH (ref 0–99)
NONHDL: 153.06
Triglycerides: 50 mg/dL (ref 0.0–149.0)
VLDL: 10 mg/dL (ref 0.0–40.0)

## 2016-01-30 LAB — VITAMIN D 25 HYDROXY (VIT D DEFICIENCY, FRACTURES): VITD: 37.48 ng/mL (ref 30.00–100.00)

## 2016-01-30 LAB — TSH: TSH: 1.78 u[IU]/mL (ref 0.35–4.50)

## 2016-01-30 MED ORDER — MONTELUKAST SODIUM 10 MG PO TABS: 10.0000 mg | ORAL_TABLET | Freq: Every day | ORAL | 3 refills | Status: DC

## 2016-01-30 MED ORDER — FLUTICASONE PROPIONATE HFA 220 MCG/ACT IN AERO: INHALATION_SPRAY | RESPIRATORY_TRACT | 3 refills | Status: DC

## 2016-01-30 NOTE — Patient Instructions (Signed)
Today we updated your med list in our EPIC system...    Continue your current medications the same...    We refilled the meds you requested...  Today we did your follow up FASTING blood work...    We will contact you w/ the results when available...   I recommend a baseline BONE DENSITY TEST...    We can sched this at your convenience (eg- when [name] MostCharles gets his physical)  Keep up the good work w/ diet & exercise...  Call for any questions or if we can be of service in any way...  Let's plan a follow up visit in [id number], sooner if needed for any problems.Marland Kitchen..Marland Kitchen

## 2016-01-30 NOTE — Progress Notes (Signed)
Subjective:    Patient ID: Carla Wong, female    DOB: 02/16/1955, 61 y.o.   MRN: [id number]  HPI 61 y/o WF here for a follow up visit... she is the wife of Carla Wong & they live in [place], [place]- but still maintain their general medical care here in [place]... ~  SEE PREV EPIC NOTES FOR OLDER DATA >>   ~  June 23, 2012:  73mo ROV & CPX> Carla Wong & Carla Wong still reside in [facility] & she relates a hx of having the flu, very stressful time w/ mother passing away w/ a stroke, then developed left post CWP w/ radiation across to the right & down the right arm; she went to her GP- told it was infklammed & apparently sed rate was elev (we don't have any of their data); sent to rheum w/ lots of testing & Dx w/ PMR- given low dose Pred rx ?10mg /d for 3-4wks, improved but she decided to stop the Pred on her own & weaned off (she's been off for ~49mo now); she notes symptoms come & go> we reviewed the typical course of PMR, response to Pred & method for weaning the Rx is assoc w/ sed rate- she is rec to f/u w/ rheum & ask for records to be sent for our review... We reviewed the following medical problems during today's office visit >>     AR> on OTC antihist prn, symptoms well controlled...    Asthma> on Singulair10, Flovent220-2spBid, ProventilHFA prn; she reports doing well- no asthma exac in some time, feels well, breathing good...    CHOL> on diet alone; FLP 9/12 was at goals on diet alone; Labs 6/14 showed TChol 203, TG 67, HDL 71, LDL 118 & rec to continue diet/ exercise/ etc...    GI- IBS> on Zantac prn; stable, regular BMs & no recent IBS complaints but she still hasn't had her routine screening colonoscopy & we discussed setting this up in [place]...    ?PMR> see above info regarding eval by Rheum in [facility]; advised to f/u w/ Rheum & try to get records for [place] to review...    HxLBP> on OTC analgesics as needed; she is doing satis, exercising some & we reviewed back strengthening exercises etc...    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 We reviewed prob list, meds, xrays and labs> see below for updates >>   LABS 6/14:  She had extensive labs done in [facility] recently> we did FLP w/ results above...  ~  July 15, 2013:  24mo ROV & CPX> Carla Wong returns for a yearly ROV/ CPX; she reports a good yr overall- no new complaints or concerns... We reviewed the following medical problems during today's office visit >>     AR> on OTC Zyrtek & Nasonex; symptoms well controlled in general...    Asthma> on Singulair10, Flovent220-2spBid, ProventilHFA prn; she reports doing well- no asthma exac in some time, feels well, breathing good...    CHOL> on diet alone; FLP 7/15 showed TChol 235, TG 47, HDL 92, LDL 133 & rec to improve her low chol/ low fat diet...    GI- IBS, POLYP> on Zantac prn; she reports having her colonoscopy 11/14 in [place]=> large sessile polyp found, repeat colon done 1/15 & was OK; we do not have any records, she was asked to repeat colon in 62yrs...    GYN- DrFletcher in [place]; he does her Mammography, PAPs, and she will inquire about baseline  BMD through his office...    ?PMR> [facility]; advised to f/u w/ Rheum & try to get records for us to review=> we never received any notes, she is off Pred, doing satis w/o joint or muscle complaints...    HxLBP> on OTC analgesics as needed; she is doing satis, exercising some & we reviewed back strengthening exercises etc...    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 We reviewed prob list, meds, xrays and labs> see below for updates >> meds refilled per request...  LABS 7/15:  FLP- not at goals but HDL =92;  Chems- wnl;  CBC- wnl;  TSH=1.94...  ~  January 25, 2015:  64mo ROV & CPX>  Carla Wong continues to do well- no new complaints or concerns;  Her LMD in [place] treated her for a URI around Christmas w/ an antibiotic & resolved, no resp symptoms- denies f/c/s, cough/ sput, SOB or chest  discomfort... We reviewed the following medical problems during today's office visit >>     AR> on OTC Zyrtek & Nasonex; symptoms well controlled in general...    Asthma> on Singulair10, Flovent220-2spBid, ProventilHFA prn (hasn't needed); she reports doing well- no asthma exac in some time, feels well, breathing good...    CHOL> on diet alone; FLP 7/15 showed TChol 235, TG 47, HDL 92, LDL 133 & rec to improve her low chol/ low fat diet; FLP 1/17 showed TChol 266, TG 52, HDL 80, LDL 166 & she wants 1 more chance for diet rx!    GI- IBS, POLYP> on Zantac prn; she reports having her colonoscopy 11/14 in [place]=> large sessile polyp found, repeat colon done 1/15 & was OK; we do not have any records, she was asked to repeat colon in 5727yrs...    GYN- DrFletcher in [place]; he does her Mammography, PAPs, and she will inquire about baseline BMD through his office...    ?PMR> [facility]; advised to f/u w/ Rheum & try to get records for us to review=> we never received any notes, she is off Pred, doing satis w/o joint or muscle complaints...    HxLBP> on OTC analgesics as needed; she is doing satis, exercising some & we reviewed back strengthening exercises etc...    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 EXAM shows Afeb, VSS, O2sat=98% on RA at rest;  HEENT- neg, mallampati1;  Chest- Clear w/o w/r/r;  Heart- RR w/o m/r/g;  Abd- soft, neg, nontender;  Ext- neg w/o c/c/e;  Neuro- intact...  CXR 01/25/15>  Norm heart size, clear lungs, NAD.Marland Kitchen.Marland Kitchen.  EKG 01/25/15>  SBrady, rate57, wnl, NAD...  LABS 01/25/15>  FLP not at goals w/ TChol266/ LDL166 on diet alone;  Chems- wnl;  CBC- wnl;  TSH=1.91;  VitD=36 IMP/PLAN>>  Carla Wong is doing well clinically but her FLP continues to trendin the wrong direction; she is adamant that she can get her numbers to goal on diet/ exercise/ without meds & wants to give it one more try; we discussed getting FLP in [place] in several months & sending it to me to  review... Given 2016 Flu vaccine today ADDENDUM>>  Pt had FLP checked 05/11/15 in [place]>>  TChol 194, TG 65, HDL 70, LDL 111... GREAT JOB on diet alone, keep up the good work...   ~  January 30, 2016:  Yearly ROV & CPX>  Carla Wong returns for a [age]yr ROV & reports  a good yr- no problems and no new complaints or concerns;  She sees DrFletcher in [place] for GYN, mammograms, but has not had a baseline BMD=> we will go ahead & sched for her; we reviewed the following medical problems during today's office visit >>     AR> on OTC Zyrtek & Nasonex prn; symptoms well controlled in general...    Asthma> on Singulair10, Flovent220-2spBid, ProventilHFA prn (used <1/mo); she reports doing well- no asthma exac in some time, feels well, breathing good...    CHOL> on diet alone; FLP 1/18 showed TChol 225, TG 50, HDL 72, LDL 143 & rec to improve her low chol/ low fat diet; she has good HDL & does not want chol meds...    GI- IBS, POLYP> on Zantac OTC prn; she reports having her colonoscopy 11/14 in [place]=> large sessile polyp found, repeat colon done 1/15 & was OK; we do not have any records, she was asked to repeat colon in 2yrs...    GYN- DrFletcher in [place]; he does her Mammography, PAPs, and she will inquire about baseline BMD=> we decided to do it here +osteopenia (see below).    ?PMR> eval by Rheum in [facility] 2014; advised to f/u w/ Rheum & try to get records for [place] to review=> we never received any notes, she is off Pred, doing satis w/o joint or muscle complaints...    HxLBP> on OTC analgesics as needed; she is doing satis, exercising some & we reviewed back strengthening exercises etc...    Osteopenia> Baseline BMD here 03/06/16 showed lowest Tscore -2.2 in Lspine; Rec for calcium supplements, Women's Formula MVI, VitD 2000, weight bearing exercise...    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 EXAM shows Afeb, VSS, O2sat=97% on RA at rest;  HEENT- neg, mallampati1;  Chest- Clear w/o w/r/r;  Heart- RR w/o  m/r/g;  Abd- soft, neg, nontender;  Ext- neg w/o c/c/e;  Neuro- intact...  LABS 01/30/16> FLP- not at goals on diet alone (TChol=225, LDL=143);  Chems- wnl;  CBC- wnl;  TSH=1.78,  VitD=37;  Sed=8 IMP/PLAN>>  Venissa continues to do well- no new issues x Osteopenia & we reviewed recs for calcium, MVI, VitD & wt bearing exercise, repeat 60yrs;  We gave her 2017 FLU vaccine today & a PNEUMOVAX-23;  Continue same meds, diet, exercise...   ADDENDUM>>  Pt had BMD 03/06/16>>  Lowest Tscore was -2.2 in Lspine (Hips showed -1.2 bilat in femoral neck);  Discussed w/ pt & rec- Calcium supple in diet & OTC, Women's formula MVI, VitD 2000u daily, & weight bearing exercise...            Problem List:   ALLERGIC RHINITIS (ICD-477.9) - uses ZYRTEK, NASONEX, MUCINEX...  ASTHMA (ICD-493.90) - on SINGULAIR 10mg /d, PROVENTIL HFA 2sp Prn & FLOVENT 220 2puffsBid; she developed a refractory cough in 2012> ?asthma vs reflux related> treated w/ maximizing meds, antireflux regimen, cough syrup... ~  CXR 12/09 showed clear lungs w/ min apical pleural changes, NAD... ~  PFT 9/12 showed FVC=3.16 (99%), FEV1=2.35 (92%), %1sec=75, mid-flows=68% predicted... ~  CXR 9/12 showed clear lungs, WNL... ~  6/14:  Asthma is stable on singulair10, flovent220-2spBid & ProventilHFA prn...  ~  7/15: on OTC Zyrtek & Nasonex; symptoms well controlled in general...  Hx of HYPERCHOLESTEROLEMIA, BORDERLINE (ICD-272.4) - on diet alone... ~  FLP here 1/10 showed TChol 199, TG 48, HDL 70, LDL 120... discussed diet Rx. ~  9/11: she notes labs done this  yr by GYN & Chol was "OK" per pt hx... ~  FLP 9/12 showed TChol 177, TG 58, HDL 68, LDL 98 > all parameters at goal on diet alone. ~  FLP 6/14 on diet alone showed TChol 203, TG 67, HDL 71, LDL 118  ~  FLP 7/15 on diet alone showed TChol 235, TG 47, HDL 92, LDL 133... Asked to follow better low chol/ low fat diet... ~  FLP 1/17 showed TChol 266, TG 52, HDL 80, LDL 166... She declines meds and wants  to do better w/ diet efforts. ~  FLP 1/18 showed TChol 225, TG 50, HDL 72, LDL 143...  Jhoana still declines meds, wants to continue diet, points to good HDL values.  IRRITABLE BOWEL SYNDROME (ICD-564.1) - mild symptoms usually related to eating the wrong things...  COLON POLYP >>  ~  6/14:  She has still not scheduled her routine colonoscopy & we again reviewed the need for this important screening procedure... ~  She reports colonoscopy 11/14 in [place]> large polyp found & removed; repeat colonoscopy done 1/15 & she reports OK- told to repeat colon in 5 yrs (we do not have records from GI)  Hx of BACK PAIN, LUMBAR (ICD-724.2) - uses OTC pain meds including Ibuprofen, Tylenol... ? PMR >> eval in  in 2014, we do not have records, she is off Pred & encouraged to f/u w/ rheum & get copies of records sent to [place]...  OSTEOPENIA >> BMD 03/06/16 showed lowest Tscore -2.2 in Lspine (it was -1.2 in B-femoral necks); Rec to take calcium supplements, women's MVI, VitD2000, & wt bearing exercise, plan recheck 100yrs.  HEADACHES, HX OF (ICD-V13.8) - prev used Midrin w/ good relief, now that this is no longer avail we will switch to VICODIN for Prn use. ~  9/12:  She tells me that Midrin is once again avail & she would like this refilled...  HEALTH MAINTENANCE >> ~  GI:  She had colonoscopies 11/14 & 1/15 as above, f/u planned for 11yrs; asked to get records for [place] to review... ~  GYN= DrFletcher in [place] on topical meds; they do mammograms, PAPs, 7 she is asked to inquire about baseline BMD, or have it here... ~  Immunizations:  Reminded to get yearly seasonal Flu vaccine each fall... Had TDAP 2010... Had Pneumovax-23 [id number]   No past surgical history on file.   Outpatient Encounter Prescriptions as of 01/30/2016  Medication Sig Dispense Refill  . albuterol (PROVENTIL HFA) 108 (90 BASE) MCG/ACT inhaler Inhale 2 puffs into the lungs every 6 (six) hours as needed. 3 Inhaler 3  . cetirizine (ZYRTEC) 10 MG tablet  Take 10 mg by mouth daily.      . fluticasone (FLOVENT HFA) 220 MCG/ACT inhaler INHALE 2 SPRAYS BY MOUTH  TWO TIMES DAILY 36 g 3  . ibuprofen (ADVIL,MOTRIN) 200 MG tablet Take 200 mg by mouth every 6 (six) hours as needed.      . mometasone (NASONEX) 50 MCG/ACT nasal spray Place 2 sprays into the nose at bedtime. 48 g 3  . montelukast (SINGULAIR) 10 MG tablet Take 1 tablet (10 mg total) by mouth daily. 90 tablet 3  . Multiple Vitamin (MULTIVITAMIN) tablet Take 1 tablet by mouth daily.      . Vitamins-Lipotropics (B-50 COMPLEX) TABS Take 1 tablet by mouth daily.      . [DISCONTINUED] FLOVENT HFA 220 MCG/ACT inhaler INHALE 2 SPRAYS BY MOUTH  TWO TIMES DAILY 36 g 2  . [DISCONTINUED] montelukast (  SINGULAIR) 10 MG tablet TAKE 1 TABLET BY MOUTH  DAILY 90 tablet 0   No facility-administered encounter medications on file as of 01/30/2016.     No Known Allergies    Immunization History  Administered Date(s) Administered  . Influenza Split 10/04/2010  . Influenza,inj,Quad PF,36+ Mos 01/25/2015, 01/30/2016  . Pneumococcal Polysaccharide-23 01/30/2016  . Tdap 09/06/2008    Current Medications, Allergies, Past Medical History, Past Surgical History, Family History, and Social History were reviewed in [employer] record.    Review of Systems        The patient complains of nasal congestion, cough, and some drainage.  The patient denies fever, chills, sweats, anorexia, fatigue, weakness, malaise, weight loss, sleep disorder, blurring, diplopia, eye irritation, eye discharge, vision loss, eye pain, photophobia, earache, ear discharge, tinnitus, decreased hearing, nosebleeds, sore throat, hoarseness, chest pain, palpitations, syncope, dyspnea on exertion, orthopnea, PND, peripheral edema, dyspnea at rest, excessive sputum, hemoptysis, wheezing, pleurisy, nausea, vomiting, diarrhea, constipation, change in bowel habits, abdominal pain, melena, hematochezia, jaundice, gas/bloating,  indigestion/heartburn, dysphagia, odynophagia, dysuria, hematuria, urinary frequency, urinary hesitancy, nocturia, incontinence, back pain, joint pain, joint swelling, muscle cramps, muscle weakness, stiffness, arthritis, sciatica, restless legs, leg pain at night, leg pain with exertion, rash, itching, dryness, suspicious lesions, paralysis, paresthesias, seizures, tremors, vertigo, transient blindness, frequent falls, frequent headaches, difficulty walking, depression, anxiety, memory loss, confusion, cold intolerance, heat intolerance, polydipsia, polyphagia, polyuria, unusual weight change, abnormal bruising, bleeding, enlarged lymph nodes, urticaria, and recurrent infections.     Objective:   Physical Exam     WD, WN, 61 y/o WF in NAD GENERAL:  Alert & oriented; pleasant & cooperative... HEENT:  [place]/AT, EOM-wnl, PERRLA, Fundi-benign, EACs-clear, TMs-wnl, NOSE-congested, THROAT-clear & wnl. NECK:  Supple w/ full ROM; no JVD; normal carotid impulses w/o bruits; no thyromegaly or nodules palpated; no lymphadenopathy. CHEST:  Clear to P & A; without wheezes/ rales/ or rhonchi heard... HEART:  Regular Rhythm; without murmurs/ rubs/ or gallops detected... ABDOMEN:  Soft & nontender; normal bowel sounds; no organomegaly or masses palpated... EXT: without deformities or arthritic changes; no varicose veins/ venous insuffic/ or edema. NEURO:  CN's intact; motor testing normal; sensory testing normal; gait normal & balance OK. DERM:  No lesions noted; no rash etc...   Assessment & Plan:    Allergic rhinitis>  As noted- rx w/ Antihist, Saline, Nasonex, etc...  Asthma>  Continue regular dosing w/ her Flovent 220- 2puffs BID, plus MucinexDM, Fluids, etc...  Hyperchol>  on diet alone & FLP is not at goals but HDL is excellent- we reviewed diet/ exercise/ etc...  Hx IBS, colon polyps>  She is asymptomatic & colonoscopies followed by GI in [place]...  Hx LBP>  Doing satis, exercising, walking, no  difficulty at present...  Osteopenia>  BMD-2.2 in Lspine [id number] & rec for calcium, VitD, MVI, wt bearing exercise, repeat 42yrs.  Hx HAs>  Using Midrin as needed...   Patient's Medications  New Prescriptions   No medications on file  Previous Medications   ALBUTEROL (PROVENTIL HFA) 108 (90 BASE) MCG/ACT INHALER    Inhale 2 puffs into the lungs every 6 (six) hours as needed.   CETIRIZINE (ZYRTEC) 10 MG TABLET    Take 10 mg by mouth daily.     IBUPROFEN (ADVIL,MOTRIN) 200 MG TABLET    Take 200 mg by mouth every 6 (six) hours as needed.     MOMETASONE (NASONEX) 50 MCG/ACT NASAL SPRAY    Place 2 sprays into the nose at  bedtime.   MULTIPLE VITAMIN (MULTIVITAMIN) TABLET    Take 1 tablet by mouth daily.     VITAMINS-LIPOTROPICS (B-50 COMPLEX) TABS    Take 1 tablet by mouth daily.    Modified Medications   Modified Medication Previous Medication   FLUTICASONE (FLOVENT HFA) 220 MCG/ACT INHALER FLOVENT HFA 220 MCG/ACT inhaler      INHALE 2 SPRAYS BY MOUTH  TWO TIMES DAILY    INHALE 2 SPRAYS BY MOUTH  TWO TIMES DAILY   MONTELUKAST (SINGULAIR) 10 MG TABLET montelukast (SINGULAIR) 10 MG tablet      Take 1 tablet (10 mg total) by mouth daily.    TAKE 1 TABLET BY MOUTH  DAILY  Discontinued Medications   No medications on file

## 2016-02-06 NOTE — Progress Notes (Signed)
Spoke with patient and informed her of results and recommendations. Pt asked about prior cholesterol level in which result was given. Pt did not have any further questions. Nothing further needed.

## 2016-02-14 ENCOUNTER — Other Ambulatory Visit: Payer: Self-pay | Admitting: Pulmonary Disease

## 2016-02-14 DIAGNOSIS — Z0000 Encounter for general adult medical examination without abnormal findings: Secondary | ICD-10-CM

## 2016-03-05 ENCOUNTER — Ambulatory Visit (INDEPENDENT_AMBULATORY_CARE_PROVIDER_SITE_OTHER)
Admission: RE | Admit: 2016-03-05 | Discharge: 2016-03-05 | Disposition: A | Discharge status: Home / Self Care | Payer: 59 | Source: Ambulatory Visit | Attending: Pulmonary Disease | Admitting: Pulmonary Disease

## 2016-03-05 DIAGNOSIS — Z0000 Encounter for general adult medical examination without abnormal findings: Secondary | ICD-10-CM

## 2016-03-05 DIAGNOSIS — Z13820 Encounter for screening for osteoporosis: Secondary | ICD-10-CM

## 2016-05-05 ENCOUNTER — Telehealth: Payer: Self-pay | Admitting: Pulmonary Disease

## 2016-05-05 MED ORDER — PREDNISONE 20 MG PO TABS: ORAL_TABLET | ORAL | 0 refills | Status: DC

## 2016-05-05 NOTE — Telephone Encounter (Signed)
Spoke with pt, c/o worsening PND, sinus congestion, prod cough with clear mucus worse when laying down and qam.  Denies fever, chest pain, nausea, vomiting.   Pt has been using nasocort nasal spray and tessalon perles which are helping but s/s are still persistent.    [facility] uses [facility] [place].   SN please advise on further recs.  Thanks!

## 2016-05-05 NOTE — Telephone Encounter (Signed)
Per SN---  Allergy tree pollen is getting bad.  Use nasal saline every 1-2 hours while awake to clear the nasal passages, call in predniosne 20 1 po bid x 4 days 1 daily x 4 days 1/2 daily until gone No salt while on pred.     Called and spoke with pt and she is aware of SN recs. Nothing further is needed.

## 2016-11-17 ENCOUNTER — Ambulatory Visit (INDEPENDENT_AMBULATORY_CARE_PROVIDER_SITE_OTHER): Payer: 59

## 2016-11-17 DIAGNOSIS — Z23 Encounter for immunization: Secondary | ICD-10-CM | POA: Diagnosis not present

## 2016-12-08 ENCOUNTER — Other Ambulatory Visit: Payer: Self-pay | Admitting: Pulmonary Disease

## 2017-02-02 ENCOUNTER — Telehealth: Payer: Self-pay | Admitting: Pulmonary Disease

## 2017-02-02 NOTE — Telephone Encounter (Signed)
Per SN: Labwork will be ordered after appt to determine what exactly she needs. Pt called and informed.

## 2017-02-02 NOTE — Telephone Encounter (Signed)
Carla Wong can you see if SN will order labs for pt before she comes in? Her appt is on 2/7. Please advise.

## 2017-02-04 ENCOUNTER — Ambulatory Visit: Payer: 59 | Admitting: Pulmonary Disease

## 2017-02-12 ENCOUNTER — Ambulatory Visit: Payer: 59 | Admitting: Pulmonary Disease

## 2017-02-18 ENCOUNTER — Ambulatory Visit (INDEPENDENT_AMBULATORY_CARE_PROVIDER_SITE_OTHER): Payer: 59 | Admitting: Pulmonary Disease

## 2017-02-18 ENCOUNTER — Other Ambulatory Visit (INDEPENDENT_AMBULATORY_CARE_PROVIDER_SITE_OTHER): Payer: 59

## 2017-02-18 ENCOUNTER — Encounter: Payer: Self-pay | Admitting: Pulmonary Disease

## 2017-02-18 ENCOUNTER — Ambulatory Visit (INDEPENDENT_AMBULATORY_CARE_PROVIDER_SITE_OTHER)
Admission: RE | Admit: 2017-02-18 | Discharge: 2017-02-18 | Disposition: A | Discharge status: Home / Self Care | Payer: 59 | Source: Ambulatory Visit | Attending: Pulmonary Disease | Admitting: Pulmonary Disease

## 2017-02-18 VITALS — BP 122/70 | HR 66 | Temp 97.6°F | Ht 63.0 in | Wt 136.0 lb

## 2017-02-18 DIAGNOSIS — F419 Anxiety disorder, unspecified: Secondary | ICD-10-CM

## 2017-02-18 DIAGNOSIS — Z860100 Personal history of colon polyps, unspecified: Secondary | ICD-10-CM

## 2017-02-18 DIAGNOSIS — J4520 Mild intermittent asthma, uncomplicated: Secondary | ICD-10-CM

## 2017-02-18 DIAGNOSIS — Z0000 Encounter for general adult medical examination without abnormal findings: Secondary | ICD-10-CM

## 2017-02-18 DIAGNOSIS — E785 Hyperlipidemia, unspecified: Secondary | ICD-10-CM

## 2017-02-18 DIAGNOSIS — K589 Irritable bowel syndrome without diarrhea: Secondary | ICD-10-CM

## 2017-02-18 DIAGNOSIS — J301 Allergic rhinitis due to pollen: Secondary | ICD-10-CM

## 2017-02-18 LAB — CBC WITH DIFFERENTIAL/PLATELET
BASOS PCT: 1.3 % (ref 0.0–3.0)
Basophils Absolute: 0.1 10*3/uL (ref 0.0–0.1)
EOS PCT: 1.7 % (ref 0.0–5.0)
Eosinophils Absolute: 0.1 10*3/uL (ref 0.0–0.7)
HCT: 44.4 % (ref 36.0–46.0)
HEMOGLOBIN: 15 g/dL (ref 12.0–15.0)
Lymphocytes Relative: 36.3 % (ref 12.0–46.0)
Lymphs Abs: 1.7 10*3/uL (ref 0.7–4.0)
MCHC: 33.8 g/dL (ref 30.0–36.0)
MCV: 88.6 fl (ref 78.0–100.0)
MONOS PCT: 8.9 % (ref 3.0–12.0)
Monocytes Absolute: 0.4 10*3/uL (ref 0.1–1.0)
Neutro Abs: 2.5 10*3/uL (ref 1.4–7.7)
Neutrophils Relative %: 51.8 % (ref 43.0–77.0)
Platelets: 195 10*3/uL (ref 150.0–400.0)
RBC: 5.01 Mil/uL (ref 3.87–5.11)
RDW: 13.2 % (ref 11.5–15.5)
WBC: 4.7 10*3/uL (ref 4.0–10.5)

## 2017-02-18 LAB — COMPREHENSIVE METABOLIC PANEL
ALK PHOS: 41 U/L (ref 39–117)
ALT: 18 U/L (ref 0–35)
AST: 20 U/L (ref 0–37)
Albumin: 4.2 g/dL (ref 3.5–5.2)
BUN: 14 mg/dL (ref 6–23)
CO2: 32 meq/L (ref 19–32)
Calcium: 9.5 mg/dL (ref 8.4–10.5)
Chloride: 103 mEq/L (ref 96–112)
Creatinine, Ser: 0.71 mg/dL (ref 0.40–1.20)
GFR: 88.8 mL/min (ref >60.00)
GLUCOSE: 103 mg/dL — AB (ref 70–99)
POTASSIUM: 4.3 meq/L (ref 3.5–5.1)
SODIUM: 142 meq/L (ref 135–145)
Total Bilirubin: 0.7 mg/dL (ref 0.2–1.2)
Total Protein: 7.1 g/dL (ref 6.0–8.3)

## 2017-02-18 LAB — TSH: TSH: 1.98 u[IU]/mL (ref 0.35–4.50)

## 2017-02-18 LAB — LIPID PANEL
CHOL/HDL RATIO: 3
Cholesterol: 220 mg/dL — ABNORMAL HIGH (ref 0–200)
HDL: 72.8 mg/dL (ref >39.00)
LDL CALC: 133 mg/dL — AB (ref 0–99)
NONHDL: 147.08
Triglycerides: 68 mg/dL (ref 0.0–149.0)
VLDL: 13.6 mg/dL (ref 0.0–40.0)

## 2017-02-18 LAB — SEDIMENTATION RATE: SED RATE: 5 mm/h (ref 0–30)

## 2017-02-18 NOTE — Progress Notes (Signed)
Subjective:    Patient ID: Carla Wong, female    DOB: February 10, 1955, 62 y.o.   MRN: [id number]  HPI 62 y/o WF here for a follow up visit... she is the wife of Laqueshia Cihlar & they live in [place]- but still maintain their general medical care here in [place]... ~  SEE PREV EPIC NOTES FOR OLDER DATA >>   ~  June 23, 2012:  17mo ROV & CPX> Happy & Carla Wong still reside in [facility] & she relates a hx of having the flu, very stressful time w/ mother passing away w/ a stroke, then developed left post CWP w/ radiation across to the right & down the right arm; she went to her GP- told it was infklammed & apparently sed rate was elev (we don't have any of their data); sent to rheum w/ lots of testing & Dx w/ PMR- given low dose Pred rx ?10mg /d for 3-4wks, improved but she decided to stop the Pred on her own & weaned off (she's been off for ~80mo now); she notes symptoms come & go> we reviewed the typical course of PMR, response to Pred & method for weaning the Rx is assoc w/ sed rate- she is rec to f/u w/ rheum & ask for records to be sent for our review... We reviewed the following medical problems during today's office visit >>     AR> on OTC antihist prn, symptoms well controlled...    Asthma> on Singulair10, Flovent220-2spBid, ProventilHFA prn; she reports doing well- no asthma exac in some time, feels well, breathing good...    CHOL> on diet alone; FLP 9/12 was at goals on diet alone; Labs 6/14 showed TChol 203, TG 67, HDL 71, LDL 118 & rec to continue diet/ exercise/ etc...    GI- IBS> on Zantac prn; stable, regular BMs & no recent IBS complaints but she still hasn't had her routine screening colonoscopy & we discussed setting this up in [place]...    ?PMR> see above info regarding eval by Rheum in [facility]; advised to f/u w/ Rheum & try to get records for [place] to review...    HxLBP> on OTC analgesics as needed; she is doing satis, exercising some & we reviewed back strengthening exercises etc...    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 We reviewed prob list, meds, xrays and labs> see below for updates >>   LABS 6/14:  She had extensive labs done in [facility] recently> we did FLP w/ results above...  ~  July 15, 2013:  42mo ROV & CPX> Carla Wong returns for a yearly ROV/ CPX; she reports a good yr overall- no new complaints or concerns... We reviewed the following medical problems during today's office visit >>     AR> on OTC Zyrtek & Nasonex; symptoms well controlled in general...    Asthma> on Singulair10, Flovent220-2spBid, ProventilHFA prn; she reports doing well- no asthma exac in some time, feels well, breathing good...    CHOL> on diet alone; FLP 7/15 showed TChol 235, TG 47, HDL 92, LDL 133 & rec to improve her low chol/ low fat diet...    GI- IBS, POLYP> on Zantac prn; she reports having her colonoscopy 11/14 in [place]=> large sessile polyp found, repeat colon done 1/15 & was OK; we do not have any records, she was asked to repeat colon in 11yrs...    GYN- DrFletcher in [place]; he does her Mammography, PAPs, and she will inquire about baseline  BMD through his office...    ?PMR> [facility]; advised to f/u w/ Rheum & try to get records for us to review=> we never received any notes, she is off Pred, doing satis w/o joint or muscle complaints...    HxLBP> on OTC analgesics as needed; she is doing satis, exercising some & we reviewed back strengthening exercises etc...    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 We reviewed prob list, meds, xrays and labs> see below for updates >> meds refilled per request...  LABS 7/15:  FLP- not at goals but HDL =92;  Chems- wnl;  CBC- wnl;  TSH=1.94...  ~  January 25, 2015:  63mo ROV & CPX>  Carla Wong continues to do well- no new complaints or concerns;  Her LMD in [place] treated her for a URI around Christmas w/ an antibiotic & resolved, no resp symptoms- denies f/c/s, cough/ sput, SOB or chest  discomfort... We reviewed the following medical problems during today's office visit >>     AR> on OTC Zyrtek & Nasonex; symptoms well controlled in general...    Asthma> on Singulair10, Flovent220-2spBid, ProventilHFA prn (hasn't needed); she reports doing well- no asthma exac in some time, feels well, breathing good...    CHOL> on diet alone; FLP 7/15 showed TChol 235, TG 47, HDL 92, LDL 133 & rec to improve her low chol/ low fat diet; FLP 1/17 showed TChol 266, TG 52, HDL 80, LDL 166 & she wants 1 more chance for diet rx!    GI- IBS, POLYP> on Zantac prn; she reports having her colonoscopy 11/14 in [place]=> large sessile polyp found, repeat colon done 1/15 & was OK; we do not have any records, she was asked to repeat colon in 5272yrs...    GYN- DrFletcher in [place]; he does her Mammography, PAPs, and she will inquire about baseline BMD through his office...    ?PMR> [facility]; advised to f/u w/ Rheum & try to get records for us to review=> we never received any notes, she is off Pred, doing satis w/o joint or muscle complaints...    HxLBP> on OTC analgesics as needed; she is doing satis, exercising some & we reviewed back strengthening exercises etc...    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 EXAM shows Afeb, VSS, O2sat=98% on RA at rest;  HEENT- neg, mallampati1;  Chest- Clear w/o w/r/r;  Heart- RR w/o m/r/g;  Abd- soft, neg, nontender;  Ext- neg w/o c/c/e;  Neuro- intact...  CXR 01/25/15>  Norm heart size, clear lungs, NAD.Marland Kitchen.Marland Kitchen.  EKG 01/25/15>  SBrady, rate57, wnl, NAD...  LABS 01/25/15>  FLP not at goals w/ TChol266/ LDL166 on diet alone;  Chems- wnl;  CBC- wnl;  TSH=1.91;  VitD=36 IMP/PLAN>>  Carla Wong is doing well clinically but her FLP continues to trendin the wrong direction; she is adamant that she can get her numbers to goal on diet/ exercise/ without meds & wants to give it one more try; we discussed getting FLP in [place] in several months & sending it to me to  review... Given 2016 Flu vaccine today ADDENDUM>>  Pt had FLP checked 05/11/15 in >>  TChol 194, TG 65, HDL 70, LDL 111... GREAT JOB on diet alone, keep up the good work...  ~  January 30, 2016:  Yearly ROV & CPX>  Carla Wong returns for a 3320yr ROV & reports a  good yr- no problems and no new complaints or concerns;  She sees DrFletcher in [place] for GYN, mammograms, but has not had a baseline BMD=> we will go ahead & sched for her; we reviewed the following medical problems during today's office visit >>     nAR> on OTC Zyrtek & Nasonex prn; symptoms well controlled in general...    Asthma> on Singulair10, Flovent220-2spBid, ProventilHFA prn (used <1/mo); she reports doing well- no asthma exac in some time, feels well, breathing good...    CHOL> on diet alone; FLP 1/18 showed TChol 225, TG 50, HDL 72, LDL 143 & rec to improve her low chol/ low fat diet; she has good HDL & does not want chol meds...    GI- IBS, POLYP> on Zantac OTC prn; she reports having her colonoscopy 11/14 in [place]=> large sessile polyp found, repeat colon done 1/15 & was OK; we do not have any records, she was asked to repeat colon in 57yrs...    GYN- DrFletcher in [place]; he does her Mammography, PAPs, and she will inquire about baseline BMD=> we decided to do it here +osteopenia (see below).    ?PMR> eval by Rheum in [facility] 2014; advised to f/u w/ Rheum & try to get records for [place] to review=> we never received any notes, she is off Pred, doing satis w/o joint or muscle complaints...    HxLBP> on OTC analgesics as needed; she is doing satis, exercising some & we reviewed back strengthening exercises etc...    Osteopenia> Baseline BMD here 03/06/16 showed lowest Tscore -2.2 in Lspine; Rec for calcium supplements, Women's Formula MVI, VitD 2000, weight bearing exercise...    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 EXAM shows Afeb, VSS, O2sat=97% on RA at rest;  HEENT- neg, mallampati1;  Chest- Clear w/o w/r/r;  Heart- RR w/o  m/r/g;  Abd- soft, neg, nontender;  Ext- neg w/o c/c/e;  Neuro- intact...  LABS 01/30/16> FLP- not at goals on diet alone (TChol=225, LDL=143);  Chems- wnl;  CBC- wnl;  TSH=1.78,  VitD=37;  Sed=8 IMP/PLAN>>  Carla Wong continues to do well- no new issues x Osteopenia & we reviewed recs for calcium, MVI, VitD & wt bearing exercise, repeat 89yrs;  We gave her 2017 FLU vaccine today & a PNEUMOVAX-23;  Continue same meds, diet, exercise...  ADDENDUM>>  Pt had BMD 03/06/16>>  Lowest Tscore was -2.2 in Lspine (Hips showed -1.2 bilat in femoral neck);  Discussed w/ pt & rec- Calcium supple in diet & OTC, Women's formula MVI, VitD 2000u daily, & weight bearing exercise...    ~  February 18, 2017:  Yearly ROV & CPX>  Carla Wong reports a good year overall, just c/o some GI issues- IBS-D symptoms treated by her GI in [place] w/ Xifaxan (no better), c/o crampy lower abd pain, loose stools ~6-7 per day, and recently started on something new (?Viberzi) per her GI specialist- we discussed options and rec for probiotic/ activia;  Note: they have checked her stool and sched a colonoscopy soon...  We reviewed the following medical problems during today's office visit>      AR> on OTC Zyrtek & Nasonex prn; symptoms well controlled in general...    Asthma> on Singulair10, Flovent220-2spBid, ProventilHFA prn (used <1/mo); she reports doing well- no asthma exac in some time, feels well, breathing good w/o cough/ sput/ wheezing/ dyspnea...    CHOL> on diet alone; FLP 2/19 showed TChol 220, TG 68, HDL 73, LDL  133 & rec to improve her low chol/ low fat diet; she has good HDL & does not want chol meds...    GI- IBS-d, Polyps> on ?Viberzi per GI + Zantac OTC prn; she reports having her colonoscopy 11/14 in [place]=> large sessile polyp found, repeat colon done 1/15 & was OK; we do not have any records, she was asked to repeat colon in 29yrs...    GYN- DrFletcher in [place]; he does her Mammography, PAPs, and she will inquire about baseline BMD=>  we decided to do it here (03/2016) +osteopenia w/ lowest Tscore -2.2 in Lspine=> rec calcium, womensMVI, VitD2000 & wt bearing exercise.    ?PMR> eval by Rheum in [facility] 2014; advised to f/u w/ Rheum & try to get records for [place] to review=> we never received any notes, she is off Pred, doing satis w/o joint or muscle complaints; Sed here 2/19 = 5...    HxLBP> on OTC analgesics as needed; she is doing satis, exercising some & we reviewed back strengthening exercises etc...    Osteopenia> Baseline BMD here 03/06/16 showed lowest Tscore -2.2 in Lspine; Rec for calcium supplements, Women's Formula MVI, VitD 2000, weight bearing exercise...    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 EXAM shows Afeb, VSS, O2sat=97% on RA at rest;  HEENT- neg, mallampati1;  Chest- Clear w/o w/r/r;  Heart- RR w/o m/r/g;  Abd- soft, neg, nontender;  Ext- neg w/o c/c/e;  Neuro- intact...  CXR 02/18/17>  Norm heart size, essentially clear lungs- NAD  LABS 02/18/17>  FLP- not at goals w/ TChol220 & ZOX096 but HDL73;  Chems- wnl K=4.3, BS=103, Cr=0.71, LFTs wnl;  CBC- Hg=15.0, WBC=4.7, eos=1.7%;  TSH=1.98;  Sed=5;  VitD= 41 (18-72) IMP/PLAN>>  Carla Wong is stable medically, mild intermittent asthma under good control w/ inhaled Flovent, allergies treated w/ Singulair + Zyrtek/ Nasonex;  Her biggest prob is her IBS-d but hopefully improving on ?Viberzi from her GI in [place];  We reviewed diet/ exercise/ and rec continuing the same meds...            Problem List:   ALLERGIC RHINITIS (ICD-477.9) - uses ZYRTEK, NASONEX, MUCINEX...  ASTHMA (ICD-493.90) - on SINGULAIR 10mg /d, PROVENTIL HFA 2sp Prn & FLOVENT 220 2puffsBid; she developed a refractory cough in 2012> ?asthma vs reflux related> treated w/ maximizing meds, antireflux regimen, cough syrup... ~  CXR 12/09 showed clear lungs w/ min apical pleural changes, NAD... ~  PFT 9/12 showed FVC=3.16 (99%), FEV1=2.35 (92%), %1sec=75, mid-flows=68% predicted... ~   CXR 9/12 showed clear lungs, WNL... ~  6/14:  Asthma is stable on singulair10, flovent220-2spBid & ProventilHFA prn...  ~  7/15: on OTC Zyrtek & Nasonex; symptoms well controlled in general...  Hx of HYPERCHOLESTEROLEMIA, BORDERLINE (ICD-272.4) - on diet alone... ~  FLP here 1/10 showed TChol 199, TG 48, HDL 70, LDL 120... discussed diet Rx. ~  9/11: she notes labs done this yr by GYN & Chol was "OK" per pt hx... ~  FLP 9/12 showed TChol 177, TG 58, HDL 68, LDL 98 > all parameters at goal on diet alone. ~  FLP 6/14 on diet alone showed TChol 203, TG 67, HDL 71, LDL 118  ~  FLP 7/15 on diet alone showed TChol 235, TG 47, HDL 92, LDL 133... Asked to follow better low chol/ low fat diet... ~  FLP 1/17 showed TChol 266, TG 52, HDL 80, LDL 166... She declines meds and wants to do better w/ diet  efforts. ~  FLP 1/18 showed TChol 225, TG 50, HDL 72, LDL 143...  Carla Wong still declines meds, wants to continue diet, points to good HDL values.  IRRITABLE BOWEL SYNDROME (ICD-564.1) - mild symptoms usually related to eating the wrong things...  COLON POLYP >>  ~  6/14:  She has still not scheduled her routine colonoscopy & we again reviewed the need for this important screening procedure... ~  She reports colonoscopy 11/14 in [place]> large polyp found & removed; repeat colonoscopy done 1/15 & she reports OK- told to repeat colon in 5 yrs (we do not have records from GI)  Hx of BACK PAIN, LUMBAR (ICD-724.2) - uses OTC pain meds including Ibuprofen, Tylenol... ? PMR >> eval in [place] in 2014, we do not have records, she is off Pred & encouraged to f/u w/ rheum & get copies of records sent to [place]...  OSTEOPENIA >> BMD 03/06/16 showed lowest Tscore -2.2 in Lspine (it was -1.2 in B-femoral necks); Rec to take calcium supplements, women's MVI, VitD2000, & wt bearing exercise, plan recheck 68yrs.  HEADACHES, HX OF (ICD-V13.8) - prev used Midrin w/ good relief, now that this is no longer avail we will switch to VICODIN for  Prn use. ~  9/12:  She tells me that Midrin is once again avail & she would like this refilled...  HEALTH MAINTENANCE >> ~  GI:  She had colonoscopies 11/14 & 1/15 as above, f/u planned for 66yrs; asked to get records for [place] to review... ~  GYN= DrFletcher in [place] on topical meds; they do mammograms, PAPs, 7 she is asked to inquire about baseline BMD, or have it here... ~  Immunizations:  Reminded to get yearly seasonal Flu vaccine each fall... Had TDAP 2010... Had Pneumovax-23 [id number]   History reviewed. No pertinent surgical history.   Outpatient Encounter Medications as of 02/18/2017  Medication Sig  . albuterol (PROVENTIL HFA) 108 (90 BASE) MCG/ACT inhaler Inhale 2 puffs into the lungs every 6 (six) hours as needed.  . cetirizine (ZYRTEC) 10 MG tablet Take 10 mg by mouth daily.    . fluticasone (FLOVENT HFA) 220 MCG/ACT inhaler INHALE 2 SPRAYS BY MOUTH  TWO TIMES DAILY  . ibuprofen (ADVIL,MOTRIN) 200 MG tablet Take 200 mg by mouth every 6 (six) hours as needed.    . mometasone (NASONEX) 50 MCG/ACT nasal spray Place 2 sprays into the nose at bedtime.  . montelukast (SINGULAIR) 10 MG tablet TAKE 1 TABLET BY MOUTH  DAILY  . Multiple Vitamin (MULTIVITAMIN) tablet Take 1 tablet by mouth daily.    . Vitamins-Lipotropics (B-50 COMPLEX) TABS Take 1 tablet by mouth daily.    . [DISCONTINUED] predniSONE (DELTASONE) 20 MG tablet Take 1 by mouth BID x 4 days, 1 daily x 4 days, 1/2 tab daily until gone   No facility-administered encounter medications on file as of 02/18/2017.     No Known Allergies    Immunization History  Administered Date(s) Administered  . Influenza Split 10/04/2010  . Influenza,inj,Quad PF,6+ Mos 01/25/2015, 01/30/2016, 11/17/2016  . Pneumococcal Polysaccharide-23 01/30/2016  . Tdap 09/06/2008    Current Medications, Allergies, Past Medical History, Past Surgical History, Family History, and Social History were reviewed in [employer] record.     Review of Systems        The patient complains of nasal congestion, cough, and some drainage.  The patient denies fever, chills, sweats, anorexia, fatigue, weakness, malaise, weight loss, sleep disorder, blurring, diplopia, eye irritation, eye discharge,  vision loss, eye pain, photophobia, earache, ear discharge, tinnitus, decreased hearing, nosebleeds, sore throat, hoarseness, chest pain, palpitations, syncope, dyspnea on exertion, orthopnea, PND, peripheral edema, dyspnea at rest, excessive sputum, hemoptysis, wheezing, pleurisy, nausea, vomiting, diarrhea, constipation, change in bowel habits, abdominal pain, melena, hematochezia, jaundice, gas/bloating, indigestion/heartburn, dysphagia, odynophagia, dysuria, hematuria, urinary frequency, urinary hesitancy, nocturia, incontinence, back pain, joint pain, joint swelling, muscle cramps, muscle weakness, stiffness, arthritis, sciatica, restless legs, leg pain at night, leg pain with exertion, rash, itching, dryness, suspicious lesions, paralysis, paresthesias, seizures, tremors, vertigo, transient blindness, frequent falls, frequent headaches, difficulty walking, depression, anxiety, memory loss, confusion, cold intolerance, heat intolerance, polydipsia, polyphagia, polyuria, unusual weight change, abnormal bruising, bleeding, enlarged lymph nodes, urticaria, and recurrent infections.     Objective:   Physical Exam     WD, WN, 62 y/o WF in NAD GENERAL:  Alert & oriented; pleasant & cooperative... HEENT:  [place]/AT, EOM-wnl, PERRLA, Fundi-benign, EACs-clear, TMs-wnl, NOSE-congested, THROAT-clear & wnl. NECK:  Supple w/ full ROM; no JVD; normal carotid impulses w/o bruits; no thyromegaly or nodules palpated; no lymphadenopathy. CHEST:  Clear to P & A; without wheezes/ rales/ or rhonchi heard... HEART:  Regular Rhythm; without murmurs/ rubs/ or gallops detected... ABDOMEN:  Soft & nontender; normal bowel sounds; no organomegaly or masses  palpated... EXT: without deformities or arthritic changes; no varicose veins/ venous insuffic/ or edema. NEURO:  CN's intact; motor testing normal; sensory testing normal; gait normal & balance OK. DERM:  No lesions noted; no rash etc...   Assessment & Plan:    02/18/17>   Carla Wong is stable medically, mild intermittent asthma under good control w/ inhaled Flovent, allergies treated w/ Singulair + Zyrtek/ Nasonex;  Her biggest prob is her IBS-d but hopefully improving on ?Viberzi from her GI in [place];  We reviewed diet/ exercise/ and rec continuing the same meds   Allergic rhinitis>  As noted- rx w/ Antihist, Saline, Nasonex, etc...  Asthma>  Continue regular dosing w/ her Flovent 220- 2puffs BID, plus MucinexDM, Fluids, etc...  Hyperchol>  on diet alone & FLP is not at goals but HDL is excellent- we reviewed diet/ exercise/ etc...  Hx IBS, colon polyps>  She is asymptomatic & colonoscopies followed by GI in [place]...  Hx LBP>  Doing satis, exercising, walking, no difficulty at present...  Osteopenia>  BMD-2.2 in Lspine [id number] & rec for calcium, VitD, MVI, wt bearing exercise, repeat 63yrs.  Hx HAs>  Using Midrin as needed...   Patient's Medications  New Prescriptions   No medications on file  Previous Medications   ALBUTEROL (PROVENTIL HFA) 108 (90 BASE) MCG/ACT INHALER    Inhale 2 puffs into the lungs every 6 (six) hours as needed.   CETIRIZINE (ZYRTEC) 10 MG TABLET    Take 10 mg by mouth daily.     FLUTICASONE (FLOVENT HFA) 220 MCG/ACT INHALER    INHALE 2 SPRAYS BY MOUTH  TWO TIMES DAILY   IBUPROFEN (ADVIL,MOTRIN) 200 MG TABLET    Take 200 mg by mouth every 6 (six) hours as needed.     MOMETASONE (NASONEX) 50 MCG/ACT NASAL SPRAY    Place 2 sprays into the nose at bedtime.   MONTELUKAST (SINGULAIR) 10 MG TABLET    TAKE 1 TABLET BY MOUTH  DAILY   MULTIPLE VITAMIN (MULTIVITAMIN) TABLET    Take 1 tablet by mouth daily.     VITAMINS-LIPOTROPICS (B-50 COMPLEX) TABS    Take 1 tablet  by mouth daily.    Modified Medications   No  medications on file  Discontinued Medications   PREDNISONE (DELTASONE) 20 MG TABLET    Take 1 by mouth BID x 4 days, 1 daily x 4 days, 1/2 tab daily until gone

## 2017-02-18 NOTE — Patient Instructions (Addendum)
Today we updated your med list in our EPIC system...    Continue your current medications the same...  Today we checked your follow up CXR & FASTING blood work...    We will contact you w/ the results when available...     I will send copies to DrSingh in [place]...  We discussed adding in some ACTIVIA yogurt each AM & try the Probiotic (Align, Vear ClockPhillips, etc) at dinner...  Call for any questions or if we can be of service in any way.Marland Kitchen..Marland Kitchen

## 2017-02-21 LAB — VITAMIN D 1,25 DIHYDROXY
VITAMIN D 1, 25 (OH) TOTAL: 41 pg/mL (ref 18–72)
Vitamin D2 1, 25 (OH)2: <8 pg/mL
Vitamin D3 1, 25 (OH)2: 41 pg/mL

## 2017-03-05 ENCOUNTER — Other Ambulatory Visit: Payer: Self-pay | Admitting: *Deleted

## 2017-03-05 MED ORDER — MONTELUKAST SODIUM 10 MG PO TABS: 10.0000 mg | ORAL_TABLET | Freq: Every day | ORAL | 3 refills | Status: DC

## 2017-06-14 ENCOUNTER — Other Ambulatory Visit: Payer: Self-pay | Admitting: Pulmonary Disease

## 2018-03-11 ENCOUNTER — Other Ambulatory Visit: Payer: Self-pay | Admitting: Adult Health

## 2018-03-11 DIAGNOSIS — Z0000 Encounter for general adult medical examination without abnormal findings: Secondary | ICD-10-CM

## 2018-03-11 MED ORDER — MONTELUKAST SODIUM 10 MG PO TABS: 10.0000 mg | ORAL_TABLET | Freq: Every day | ORAL | 0 refills | Status: DC

## 2018-03-11 NOTE — Telephone Encounter (Signed)
Received faxed refill request from [facility]  Medication name/strength/dose: Singulair 10mg  once daily Medication last rx'd: 2.28.19 by Dr Kriste Basque Quantity and number of refills last rx'd: #90 with 3 refills  Patient last seen in the office on 2.13.19 for physical with SN  Does refill need to be authorized by a provider? no Refill authorized (yes or no)?: yes, x1 with note that patient needs to establish with PCP as Dr Kriste Basque is no longer practicing with this clinic.

## 2018-03-16 ENCOUNTER — Other Ambulatory Visit: Payer: Self-pay | Admitting: Adult Health

## 2018-03-19 ENCOUNTER — Telehealth: Payer: Self-pay | Admitting: Pulmonary Disease

## 2018-03-19 MED ORDER — MONTELUKAST SODIUM 10 MG PO TABS: 10.0000 mg | ORAL_TABLET | Freq: Every day | ORAL | 0 refills | Status: DC

## 2018-03-19 MED ORDER — MONTELUKAST SODIUM 10 MG PO TABS: 10.0000 mg | ORAL_TABLET | Freq: Every day | ORAL | 0 refills | Status: AC

## 2018-03-19 NOTE — Telephone Encounter (Signed)
Call returned to patient, she states she received a call from [facility] about a refill. Patient needs a refill of Singulair but has not been seen in 1 year. Patient recently moved to [facility] and asks that will give her 90 day supply to tie her over until she can get established with a PCP. Refill sent. Note in order to not refill anymore. Voiced understanding. Nothing further is needed at this time.

## 2018-03-19 NOTE — Telephone Encounter (Signed)
LVM for patient to return call regarding refill rx for singulair 10mg . X1 Patient will need to schedule a f/u appt with NP for future refills Patient was with SN, has not been seen in 83mo

## 2018-07-05 ENCOUNTER — Other Ambulatory Visit: Payer: Self-pay | Admitting: Pulmonary Disease

## 2018-09-19 ENCOUNTER — Other Ambulatory Visit: Payer: Self-pay | Admitting: Pulmonary Disease

## 2018-10-14 IMAGING — DX DG CHEST 2V
2 series · 2 of 2 positions shown · non-contrast
Comparison: 01/25/2015

CLINICAL DATA: Asthma, former smoker

EXAM:
CHEST  2 VIEW

[chest pa]
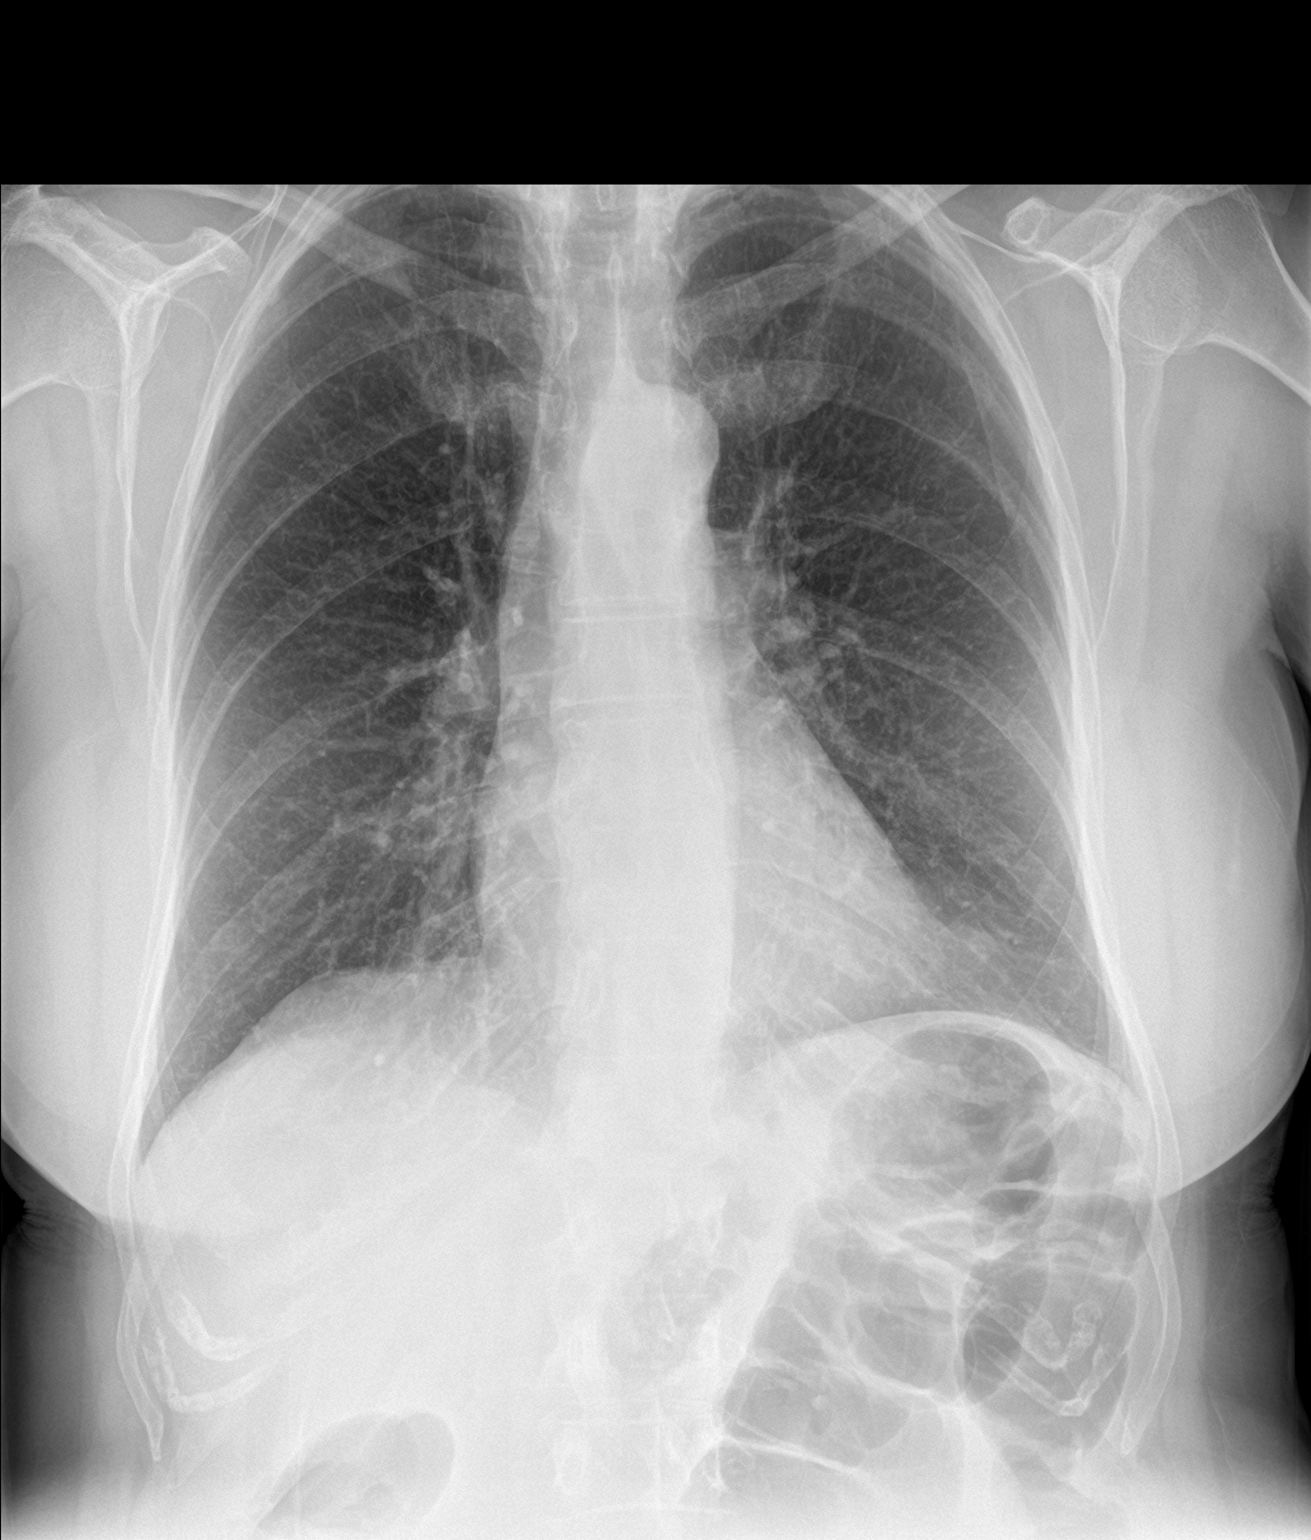

[chest lat]
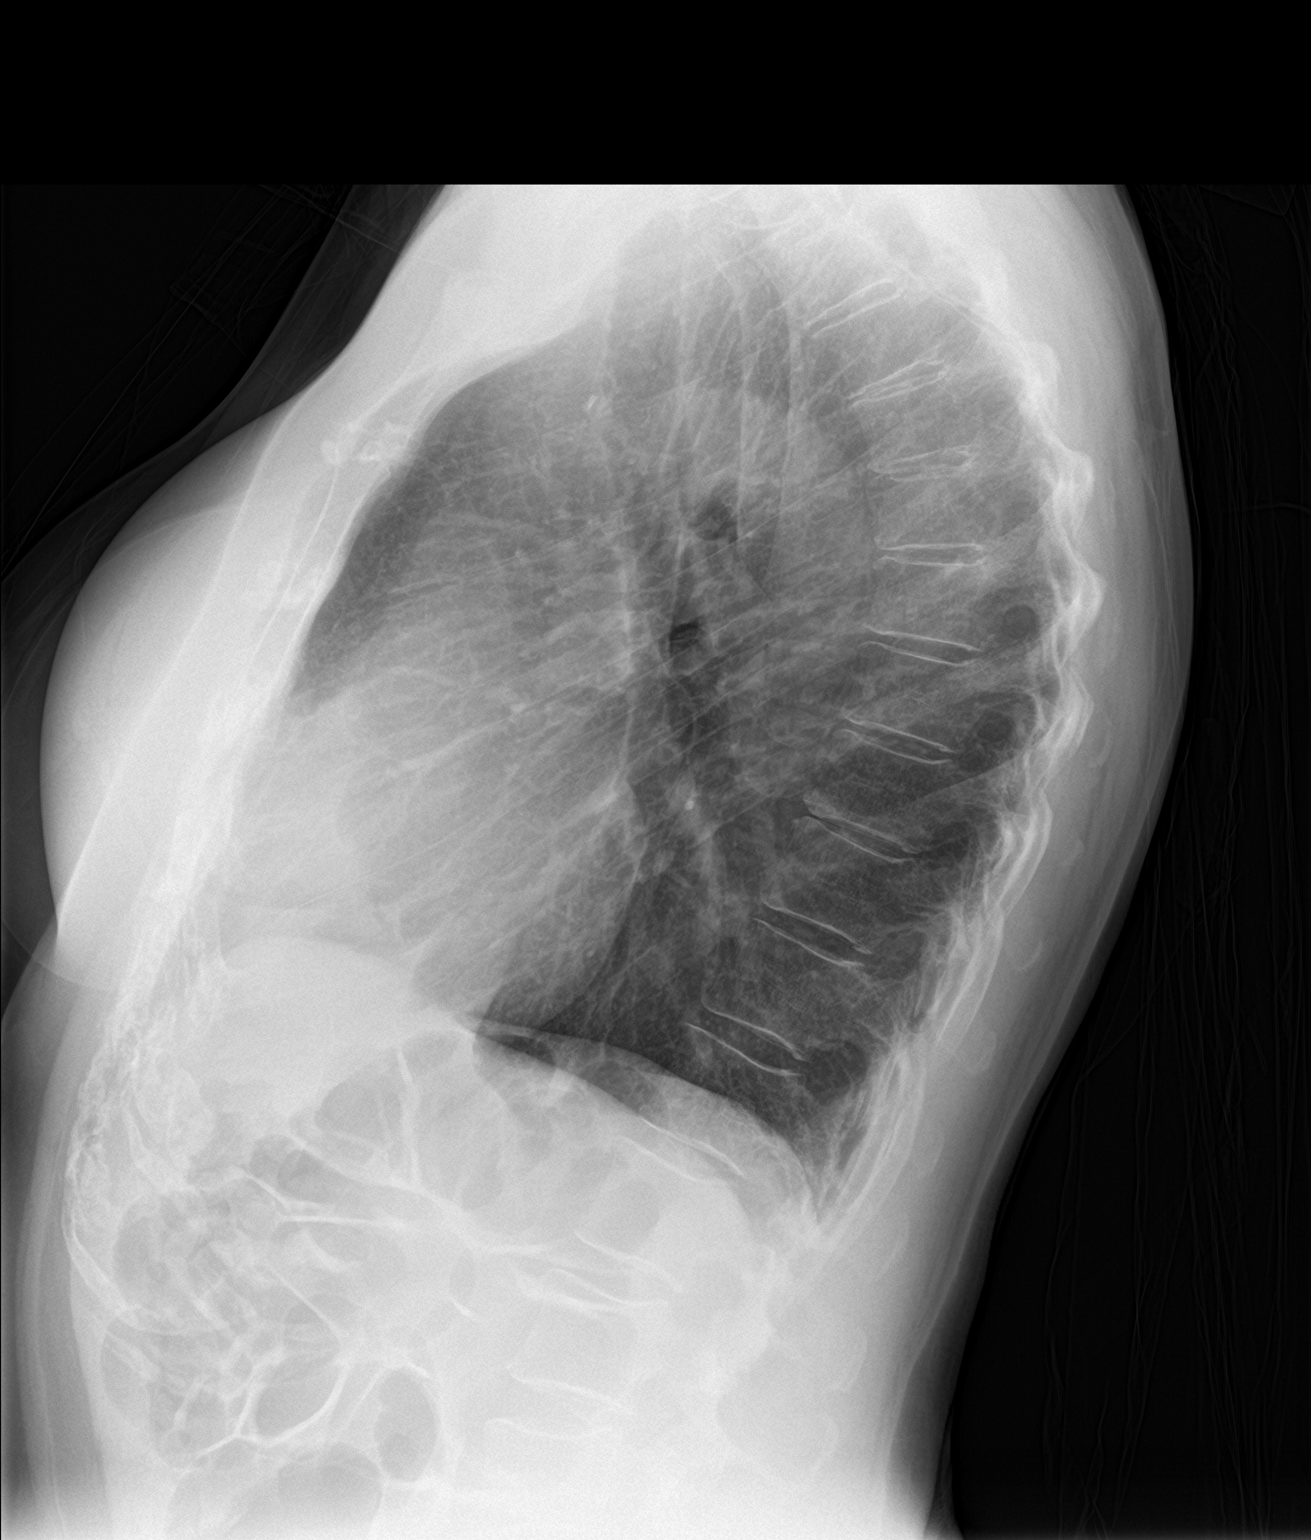

[2 of 2 positions shown; findings below may reference images not displayed]

FINDINGS: Normal heart size, mediastinal contours, and pulmonary vascularity.

Minimal chronic interstitial prominence, stable.

Lungs otherwise clear.

No pleural effusion or pneumothorax.

Bones demineralized.

BILATERAL breast prostheses noted.

Visualized bowel gas pattern in the upper abdomen unremarkable.
IMPRESSION: Minimal chronic interstitial prominence without acute abnormalities.

## 2018-11-27 ENCOUNTER — Other Ambulatory Visit: Payer: Self-pay | Admitting: Pulmonary Disease
